# Patient Record
Sex: Female | Born: 1945 | Race: White | Hispanic: No | Marital: Married
Health system: Midwestern US, Academic
[De-identification: ages and names within clinical notes are randomized; demographics above are authoritative.]

---

## 2018-09-20 ENCOUNTER — Encounter: Admit: 2018-09-20 | Discharge: 2018-09-21 | Payer: MEDICARE

## 2018-09-20 ENCOUNTER — Encounter: Admit: 2018-09-20 | Discharge: 2018-09-20 | Payer: MEDICARE

## 2018-09-20 DIAGNOSIS — J45909 Unspecified asthma, uncomplicated: ICD-10-CM

## 2018-09-20 DIAGNOSIS — I10 Essential (primary) hypertension: Principal | ICD-10-CM

## 2018-09-20 DIAGNOSIS — K229 Disease of esophagus, unspecified: ICD-10-CM

## 2018-09-20 DIAGNOSIS — E785 Hyperlipidemia, unspecified: ICD-10-CM

## 2018-09-20 DIAGNOSIS — R911 Solitary pulmonary nodule: ICD-10-CM

## 2018-09-20 MED ORDER — LABETALOL 5 MG/ML IV SYRG
5 mg | INTRAVENOUS | 0 refills | Status: DC | PRN
Start: 2018-09-20 — End: 2018-09-21

## 2018-09-20 MED ORDER — ONDANSETRON HCL (PF) 4 MG/2 ML IJ SOLN
4 mg | INTRAVENOUS | 0 refills | Status: DC | PRN
Start: 2018-09-20 — End: 2018-09-22

## 2018-09-20 MED ORDER — ALBUTEROL SULFATE 2.5 MG/0.5 ML IN NEBU
2.5 mg | RESPIRATORY_TRACT | 0 refills | Status: DC | PRN
Start: 2018-09-20 — End: 2018-09-22

## 2018-09-20 NOTE — Care Coordination-Inpatient
AOD acceptance note:    73 yo F with sudden onset dysphagia, unable to swallow spit prior to presenting to outside ER.    CT of chest showing 5cm Esophageal mass with multiple lung nodules, concerning for new cancer. O2 sat >92% on RA. NO concerns for airway compromise.    Outside hospital invoking EMTALA as they do not have GI.    WBC 12.9  HGB 14.3  HCT 43.8  Platelets 150  Lactic 1.1  NA 140  K 5.1  Chloride 105  Co2   21  BUN 24  Creatinine 1.09  Glucose 108  AST 26  ALT 23  Alk phos 87    Accepted as Med/Surg status. Will need GI consult.    Andree Coss, MD  [phone]

## 2018-09-21 ENCOUNTER — Encounter: Admit: 2018-09-21 | Discharge: 2018-09-21 | Payer: MEDICARE

## 2018-09-21 ENCOUNTER — Inpatient Hospital Stay: Admit: 2018-09-21 | Discharge: 2018-09-21 | Payer: MEDICARE

## 2018-09-21 DIAGNOSIS — J45909 Unspecified asthma, uncomplicated: ICD-10-CM

## 2018-09-21 DIAGNOSIS — K2210 Ulcer of esophagus without bleeding: Principal | ICD-10-CM

## 2018-09-21 DIAGNOSIS — I10 Essential (primary) hypertension: Principal | ICD-10-CM

## 2018-09-21 DIAGNOSIS — K229 Disease of esophagus, unspecified: ICD-10-CM

## 2018-09-21 DIAGNOSIS — E785 Hyperlipidemia, unspecified: ICD-10-CM

## 2018-09-21 LAB — TROPONIN-I: Lab: 0 ng/mL (ref 0.0–0.05)

## 2018-09-21 LAB — CBC AND DIFF
Lab: 0 10*3/uL (ref 0–0.20)
Lab: 0 10*3/uL (ref 0–0.45)
Lab: 8.5 10*3/uL (ref 4.5–11.0)

## 2018-09-21 LAB — COMPREHENSIVE METABOLIC PANEL
Lab: 11 10*3/uL — ABNORMAL HIGH (ref 3–12)
Lab: 141 MMOL/L (ref 137–147)
Lab: 15 U/L — ABNORMAL LOW (ref 7–40)
Lab: 153 mg/dL — ABNORMAL HIGH (ref 70–100)
Lab: 17 U/L (ref 7–56)
Lab: 4.3 g/dL — ABNORMAL HIGH (ref 3.5–5.0)
Lab: 6.9 g/dL (ref 6.0–8.0)
Lab: 73 U/L — ABNORMAL LOW (ref 25–110)
Lab: 9.4 mg/dL (ref 8.5–10.6)
Lab: >60 mL/min — ABNORMAL LOW (ref >60)

## 2018-09-21 LAB — PTT (APTT): Lab: 27 s (ref 24.0–36.5)

## 2018-09-21 LAB — PROTIME INR (PT): Lab: 1 (ref 0.8–1.2)

## 2018-09-21 MED ORDER — ASPIRIN 81 MG PO TBEC
81 mg | Freq: Every day | ORAL | 0 refills | Status: DC
Start: 2018-09-21 — End: 2018-09-22
  Administered 2018-09-21 – 2018-09-22 (×2): 81 mg via ORAL

## 2018-09-21 MED ORDER — PROPOFOL INJ 10 MG/ML IV VIAL
0 refills | Status: DC
Start: 2018-09-21 — End: 2018-09-21
  Administered 2018-09-21: 17:00:00 40 mg via INTRAVENOUS
  Administered 2018-09-21: 17:00:00 30 mg via INTRAVENOUS
  Administered 2018-09-21: 17:00:00 20 mg via INTRAVENOUS

## 2018-09-21 MED ORDER — ROSUVASTATIN 10 MG PO TAB
5 mg | Freq: Every day | ORAL | 0 refills | Status: DC
Start: 2018-09-21 — End: 2018-09-22
  Administered 2018-09-21 – 2018-09-22 (×2): 5 mg via ORAL

## 2018-09-21 MED ORDER — POLYETHYLENE GLYCOL 3350 17 GRAM PO PWPK
1 | Freq: Every day | ORAL | 0 refills | Status: DC | PRN
Start: 2018-09-21 — End: 2018-09-22

## 2018-09-21 MED ORDER — PROPOFOL 10 MG/ML IV EMUL 20 ML (INFUSION)(AM)(OR)
INTRAVENOUS | 0 refills | Status: DC
Start: 2018-09-21 — End: 2018-09-21
  Administered 2018-09-21: 17:00:00 120 ug/kg/min via INTRAVENOUS

## 2018-09-21 MED ORDER — LOSARTAN 50 MG PO TAB
50 mg | Freq: Every day | ORAL | 0 refills | Status: DC
Start: 2018-09-21 — End: 2018-09-22
  Administered 2018-09-21 – 2018-09-22 (×2): 50 mg via ORAL

## 2018-09-21 MED ORDER — PANTOPRAZOLE 40 MG PO TBEC
40 mg | Freq: Two times a day (BID) | ORAL | 0 refills | Status: DC
Start: 2018-09-21 — End: 2018-09-22
  Administered 2018-09-21 – 2018-09-22 (×3): 40 mg via ORAL

## 2018-09-21 MED ORDER — SODIUM CHLORIDE 0.9 % IV SOLP
INTRAVENOUS | 0 refills | Status: CN
Start: 2018-09-21 — End: ?

## 2018-09-21 MED ORDER — BENZOCAINE-MENTHOL 6-10 MG MM LOZG
1 | ORAL | 0 refills | Status: DC | PRN
Start: 2018-09-21 — End: 2018-09-22

## 2018-09-21 MED ORDER — LIDOCAINE (PF) 200 MG/10 ML (2 %) IJ SYRG
0 refills | Status: DC
Start: 2018-09-21 — End: 2018-09-21
  Administered 2018-09-21: 17:00:00 40 mg via INTRAVENOUS

## 2018-09-21 MED ORDER — HYDROCHLOROTHIAZIDE 12.5 MG PO TAB
12.5 mg | Freq: Every day | ORAL | 0 refills | Status: DC
Start: 2018-09-21 — End: 2018-09-22
  Administered 2018-09-21 – 2018-09-22 (×2): 12.5 mg via ORAL

## 2018-09-21 MED ORDER — LACTATED RINGERS IV SOLP
0 refills | Status: DC
Start: 2018-09-21 — End: 2018-09-21
  Administered 2018-09-21: 17:00:00 via INTRAVENOUS

## 2018-09-21 MED ORDER — LACTATED RINGERS IV SOLP
1000 mL | Freq: Once | INTRAVENOUS | 0 refills | Status: CP
Start: 2018-09-21 — End: ?
  Administered 2018-09-21: 16:00:00 1000 mL via INTRAVENOUS

## 2018-09-21 NOTE — H&P (View-Only)
ADMISSION HISTORY AND PHYSICAL    Name:  Karina Flores                                             MRN:  [id number]   Admission Date:  09/20/2018       ASSESSMENT & PLAN     Maleta Lerma is a 73 y.o. female with PMHx HTN, HLD, asthma who presents to [facility] as direct transfer from [facility] for further work-up of dysphagia and esophageal mass.    #Dysphagia  #Esophageal Mass  - 1 day of acute onset dysphagia to solids and liquids. Severe pain with attempting to swallow. 1 episode of hematemesis immediately with eating. Pt with long  Smoking (former), EtOH use hx. Significant family hx of cancer.  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    -1.3cm CM Rt hilar node   -Multiple pulmonary nodules, measuring up to 3mm   -Liver, spleen, pancreas, gallbladder, adreanl galnds grossly unremarkable. Moderate to severe atheroscleoric diseaes. Sub-cm retroperitoneal nodes.    Plan:  > Consult GI in AM (order placed), appreciate assistance  > Strict NPO. Pt at risk of aspiration with possible esophageal obstruction  > Hold on oncology consult until more definitive dx  > Hold on repeat imaging. OSH images clouded    #Troponinemia  - Chest pain only with swallowing. OSH trop 0.015. Now not complaining of chest pain since not eating.   Plan:   > Repeat troponin   > Obtain ECG    #Recent URI  #Hx of asthma  - Pt with asthma as a child. Recent URI sxs ~4 wks ago. Still with mild congestion and DOE. Has been using albuterol neb following walking up 2 flights of stairs.  Plan:  > Albuterol neb q4h      #HTN  #HLD  - PTA on ASA 81mg , HCTZ 12.5mg  QDay, Losartan 50mg  QDay, Rosuvastatin 5mg  QDay  Plan:  > Hold PTA meds in setting of strict NPO  > Labetalol 5mg  IV q6h PRN     Checklist:  Consults: Gastroenterology  Lines/Access: PIVs Family history     Father: Lung cancer (smoker)  Maternal aunt: Breast cancer  MGF: Throat cancer (chewing tobacco user)  Aunt: Unknown type of cancer  Uncle: Unknown type cancer    Social history     Works part-time  Smoking history: Former smoker, quit ~2015, <1 PPD x ~50 yrs (since high school)  EtOH history: current, 1 glass wine/night, started in high school.    Allergies   Oxycontin [oxycodone]    Medications     Current Facility-Administered Medications   Medication   ??? albuterol 0.5% (PROVENTIL) nebulizer solution 2.5 mg   ??? labetaloL (NORMODYNE) injection 5 mg   ??? ondansetron (ZOFRAN) injection 4 mg          Physical exam     BP: 154/76 (04/05 2100)  Temp: 37.1 ???C (98.7 ???F) (04/05 2102)  Pulse: 107 (04/05 2102)  Respirations: 18 PER MINUTE (04/05 2102)  SpO2: 97 % (04/05 2102)  Height: 157.5 cm (62) (04/05 2100) BP: (154)/(76)   Temp:  [37.1 ???C (98.7 ???F)]   Pulse:  [107]   Respirations:  Vera.August  PER MINUTE]   SpO2:  [97 %]   Vitals:    09/20/18 2100   Weight: 72.4 kg (159 lb 11.2 oz)        General:  Alert and response to question appropriately.  Appears stated age.  Lying in bed in NAD.  VS as above.  Eyes: No conjunctival injection. PERRLA. EOMI. No scleral icterus.    Ears, nose, mouth, and throat : MMM, posterior pharynx mildly erythematous without exudate, edema.  Neck:  Symmetric appearance without crepitus, no obvious mass or noticeable swelling,   Lungs:  Clear to auscultation bilaterally without wheezes, rales, or rhonchi.  No use of accessory muscles.    Cardiovascular: Regular rate, S1, S2 normal, no murmurs, clicks, rubs, or gallops appreciated .  2+ and symmetric, all extremities.  No edema in BLE.   Abdomen:  BS+, soft, no guarding or rigidity.  Nontender to palpation without palpable masses.  No rebound tenderness.   Skin: Skin color normal, no obvious evidence of rashes.   Neurologic:  Alerted and oriented .  5/5 hand-grip and distal strength.

## 2018-09-21 NOTE — Consults
accompanying hilar lymphadenopathy  2. Dysphagia/Odynophagia -sudden onset, 1 day history. No unintentional weight loss. No prior EGD or colonoscopy.  3. Hypertension   4. Asthma    4/6 EGD showing 2 linear nonbleeding esophageal ulcers which were biopsied for margins.  Medium-sized hiatal hernia.  Inflammation within the gastric cardia.  Multiple small nonbleeding in the antrum without stigmata.  Duodenum was normal.  No obstructive lesion Was seen in the esophagus or hiatal hernia sac.    In the suspicious that she may have had a food bolus impaction at the time of CT which led to the mass seen in the subsequent kissing ulcers seen on endoscopy.    Recommendations:  -PPI twice daily x8 weeks  -Repeat EGD along with colonoscopy in 8 weeks (ordered). Patient has never had screening colonoscopy.     GI will sign off at this time, please call with questions    Patient seen/discussed with Dr. Rozell Searing  GI Fellow  [phone]    -----------------------------  PMH:  Medical History:   Diagnosis Date   ??? Asthma    ??? HLD (hyperlipidemia)    ??? HTN (hypertension)        Current medications:  No current facility-administered medications on file prior to encounter.      No current outpatient medications on file prior to encounter.       PSH:  No past surgical history on file.    SH:  Social History     Socioeconomic History   ??? Marital status: Not on file     Spouse name: Not on file   ??? Number of children: Not on file   ??? Years of education: Not on file   ??? Highest education level: Not on file   Occupational History   ??? Not on file   Tobacco Use   ??? Smoking status: Former Smoker     Packs/day: 1.00     Years: 50.00     Pack years: 50.00     Types: Cigarettes     Last attempt to quit: 2015     Years since quitting: 5.2   ??? Smokeless tobacco: Never Used   Substance and Sexual Activity   ??? Alcohol use: Yes     Alcohol/week: 7.0 standard drinks     Types: 7 Glasses of wine per week     Drinks per session: 1 or 2

## 2018-09-21 NOTE — Progress Notes
Patient arrived to room # [id number]) via cart accompanied by transport. Patient transferred to the bed without assistance. Bedside safety checks completed. Initial patient assessment completed. Refer to flowsheet for details.    Admission skin assessment completed with: Maurene Capes, RN    Pressure injury present on arrival?: No    1. Head/Face/Neck: No  2. Trunk/Back: No  3. Upper Extremities: No  4. Lower Extremities: No  5. Pelvic/Coccyx: No  6. Assessed for device associated injury? Yes  7. Malnutrition Screening Tool (Nursing Nutrition Assessment) Completed? Yes    See Doc Flowsheet for additional wound details.     INTERVENTIONS:

## 2018-09-22 ENCOUNTER — Encounter: Admit: 2018-09-22 | Discharge: 2018-09-22 | Payer: MEDICARE

## 2018-09-22 ENCOUNTER — Inpatient Hospital Stay: Admit: 2018-09-21 | Discharge: 2018-09-21 | Payer: MEDICARE

## 2018-09-22 ENCOUNTER — Inpatient Hospital Stay
Admit: 2018-09-21 | Discharge: 2018-09-22 | Disposition: A | Discharge status: Home / Self Care | Payer: MEDICARE | Source: Other Acute Inpatient Hospital

## 2018-09-22 DIAGNOSIS — Z885 Allergy status to narcotic agent status: ICD-10-CM

## 2018-09-22 DIAGNOSIS — K449 Diaphragmatic hernia without obstruction or gangrene: ICD-10-CM

## 2018-09-22 DIAGNOSIS — K319 Disease of stomach and duodenum, unspecified: ICD-10-CM

## 2018-09-22 DIAGNOSIS — Z7982 Long term (current) use of aspirin: ICD-10-CM

## 2018-09-22 DIAGNOSIS — Z87891 Personal history of nicotine dependence: ICD-10-CM

## 2018-09-22 DIAGNOSIS — Z79899 Other long term (current) drug therapy: ICD-10-CM

## 2018-09-22 DIAGNOSIS — R918 Other nonspecific abnormal finding of lung field: ICD-10-CM

## 2018-09-22 DIAGNOSIS — I10 Essential (primary) hypertension: Principal | ICD-10-CM

## 2018-09-22 DIAGNOSIS — J45909 Unspecified asthma, uncomplicated: ICD-10-CM

## 2018-09-22 DIAGNOSIS — R9431 Abnormal electrocardiogram [ECG] [EKG]: ICD-10-CM

## 2018-09-22 DIAGNOSIS — R1310 Dysphagia, unspecified: ICD-10-CM

## 2018-09-22 DIAGNOSIS — K2210 Ulcer of esophagus without bleeding: Principal | ICD-10-CM

## 2018-09-22 DIAGNOSIS — E785 Hyperlipidemia, unspecified: ICD-10-CM

## 2018-09-22 DIAGNOSIS — R000 Tachycardia, unspecified: ICD-10-CM

## 2018-09-22 DIAGNOSIS — K5900 Constipation, unspecified: ICD-10-CM

## 2018-09-22 MED ORDER — PANTOPRAZOLE 40 MG PO TBEC
40 mg | ORAL_TABLET | Freq: Two times a day (BID) | ORAL | 0 refills | 90.00000 days | Status: AC
Start: 2018-09-22 — End: ?

## 2018-09-22 MED ORDER — BENZOCAINE-MENTHOL 6-10 MG MM LOZG
1 | ORAL | 0 refills | 30.00000 days | Status: AC | PRN
Start: 2018-09-22 — End: ?

## 2018-09-22 MED ORDER — POLYETHYLENE GLYCOL 3350 17 GRAM PO PWPK
17 g | Freq: Every day | ORAL | 0 refills | 18.00000 days | Status: AC | PRN
Start: 2018-09-22 — End: ?

## 2018-09-22 NOTE — Progress Notes
Trudie Reed discharged on 09/22/2018.   Marland Kitchen  Discharge instructions reviewed with patient.  Valuables returned:   Personal Items / Valuables: Cell Phone, Clothing, Eyeglasses/Contacts  Where Are Valuables Stored?: bedside.  Home medications:    n/a  Functional assessment at discharge complete: Yes .  Discharge instructions reviewed with patient. AVS provided. All questions and concerns addressed. Peripheral IV removed. Wheelchair to lobby for DC.

## 2018-09-23 NOTE — Discharge Instructions - Pharmacy
CBC AND DIFF   ??? Collection Time: 09/20/18 11:45 PM   Result Value Ref Range   ??? White Blood Cells 8.5 4.5 - 11.0 K/UL   ??? RBC 4.45 4.0 - 5.0 M/UL   ??? Hemoglobin 14.4 12.0 - 15.0 GM/DL   ??? Hematocrit 42.4 36 - 45 %   ??? MCV 95.4 80 - 100 FL   ??? MCH 32.5 26 - 34 PG   ??? MCHC 34.0 32.0 - 36.0 G/DL   ??? RDW 13.1 11 - 15 %   ??? Platelet Count 238 150 - 400 K/UL   ??? MPV 7.9 7 - 11 FL   ??? Neutrophils 95 (H) 41 - 77 %   ??? Lymphocytes 4 (L) 24 - 44 %   ??? Monocytes 1 (L) 4 - 12 %   ??? Eosinophils 0 0 - 5 %   ??? Basophils 0 0 - 2 %   ??? Absolute Neutrophil Count 8.10 (H) 1.8 - 7.0 K/UL   ??? Absolute Lymph Count 0.30 (L) 1.0 - 4.8 K/UL   ??? Absolute Monocyte Count 0.00 0 - 0.80 K/UL   ??? Absolute Eosinophil Count 0.00 0 - 0.45 K/UL   ??? Absolute Basophil Count 0.00 0 - 0.20 K/UL   COMPREHENSIVE METABOLIC PANEL   ??? Collection Time: 09/20/18 11:45 PM   Result Value Ref Range   ??? Sodium 141 137 - 147 MMOL/L   ??? Potassium 4.2 3.5 - 5.1 MMOL/L   ??? Chloride 108 98 - 110 MMOL/L   ??? Glucose 153 (H) 70 - 100 MG/DL   ??? Blood Urea Nitrogen 19 7 - 25 MG/DL   ??? Creatinine 0.85 0.4 - 1.00 MG/DL   ??? Calcium 9.4 8.5 - 10.6 MG/DL   ??? Total Protein 6.9 6.0 - 8.0 G/DL   ??? Total Bilirubin 0.4 0.3 - 1.2 MG/DL   ??? Albumin 4.3 3.5 - 5.0 G/DL   ??? Alk Phosphatase 73 25 - 110 U/L   ??? AST (SGOT) 15 7 - 40 U/L   ??? CO2 22 21 - 30 MMOL/L   ??? ALT (SGPT) 17 7 - 56 U/L   ??? Anion Gap 11 3 - 12   ??? eGFR Non African American >60 >60 mL/min   ??? eGFR African American >60 >60 mL/min   MAGNESIUM   ??? Collection Time: 09/20/18 11:45 PM   Result Value Ref Range   ??? Magnesium 2.0 1.6 - 2.6 mg/dL   TSH WITH FREE T4 REFLEX   ??? Collection Time: 09/20/18 11:45 PM   Result Value Ref Range   ??? TSH 0.93 0.35 - 5.00 MCU/ML   TROPONIN-I   ??? Collection Time: 09/20/18 11:45 PM   Result Value Ref Range   ??? Troponin-I 0.02 0.0 - 0.05 NG/ML   PTT (APTT)   ??? Collection Time: 09/21/18 12:27 AM   Result Value Ref Range   ??? APTT 27.3 24.0 - 36.5 SEC   PROTIME INR (PT) ??? Collection Time: 09/21/18 12:27 AM   Result Value Ref Range   ??? INR 1.0 0.8 - 1.2   ???  Glucose: (!) 153 (09/20/18 2345)          Brief Hospital Course:    Karina Flores is a 73 y.o. female with a PMH hx of HTN, HLD, asthma, 50 pack per year smoking hx quite in 2015, and 55 yr ETOH use who was a transfer from [facility] ER and admitted for severe odynophagia and esophageal mass. 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 1.3cm CM Rt hilar node. Multiple pulmonary nodules, measuring up to 3mm. Liver, spleen, pancreas, gallbladder, adreanl galnds grossly unremarkable. Moderate to severe atheroscleoric diseaes. Sub-cm retroperitoneal nodes. GI was consulted and an EGD was done on 4/6. This showed non-bleeding esophageal ulcers; medium sized hiatal hernia; non-bleeding erosive gastropathy; no obstructive lesion suspect food bolus at time of CT. Biopsies taken and were Hy. Pylori negative and negative for dysplasia and malignancy. She was started on a PPI x 8 weeks and will follow up with GI in 8 weeks for a repeat EGD. Also sent home with lozenges for throat pain.     Spoke with the patients PCP Dr. Herschell Dimes regarding the pulmonary nodules; he said they are <3 cm and outpatient follow up and imaging can be done. Patient want to follow up with a pulmonologist for lung nodules. A referral to [facility] was sent.         Condition at Discharge: Stable    Discharge Diagnoses:      Hospital Problems        Active Problems    * (Principal) Dysphagia    Esophageal mass    Hypertension    Hyperlipidemia    Incidental lung nodule, greater than or equal to 8mm          Surgical Procedures: None    Significant Diagnostic Studies and Procedures: noted in brief hospital course    Consults:  GI    Patient Disposition: Home       Patient instructions/medications:       Regular Diet Details   albuterol 0.5% (PROVENTIL) 2.5 mg/0.5 mL nebulizer solution Inhale 2.5 mg solution by nebulizer as directed every 6 hours as needed for Shortness of Breath or Wheezing.    PRESCRIPTION TYPE:  Historical Med      aspirin EC 81 mg tablet Take 81 mg by mouth daily. Take with food.    PRESCRIPTION TYPE:  Historical Med      calcium carbonate (TUMS) 500 mg (200 mg elemental calcium) chewable tablet Chew 500 mg by mouth daily.    PRESCRIPTION TYPE:  Historical Med      cetirizine (ZYRTEC) 10 mg tablet Take 10 mg by mouth every morning.    PRESCRIPTION TYPE:  Historical Med      hydroCHLOROthiazide (HYDRODIURIL) 12.5 mg tablet Take 12.5 mg by mouth every morning.    PRESCRIPTION TYPE:  Historical Med      losartan (COZAAR) 50 mg tablet Take 50 mg by mouth daily.    PRESCRIPTION TYPE:  Historical Med      rosuvastatin (CRESTOR) 5 mg tablet Take 5 mg by mouth daily.    PRESCRIPTION TYPE:  Historical Med           Scheduled appointments:     You will be contacted by pulmonology to set up an appointment.                Pending items needing follow up: none    Signed:  Angelica Ran, APRN-NP  09/23/2018      cc:  Primary Care Physician:  Lona Kettle, MD   Verified  Referring physicians:  Lona Kettle, MD   Additional provider(s):

## 2018-09-24 ENCOUNTER — Ambulatory Visit: Admit: 2018-12-04 | Discharge: 2018-12-04

## 2018-09-24 ENCOUNTER — Encounter: Admit: 2018-09-24 | Discharge: 2018-09-24 | Payer: MEDICARE

## 2018-09-30 ENCOUNTER — Encounter: Admit: 2018-09-30 | Discharge: 2018-09-30 | Payer: MEDICARE

## 2018-10-05 ENCOUNTER — Encounter: Admit: 2018-10-05 | Discharge: 2018-10-05 | Payer: MEDICARE

## 2018-10-09 ENCOUNTER — Encounter: Admit: 2018-10-09 | Discharge: 2018-10-09 | Payer: MEDICARE

## 2018-10-09 ENCOUNTER — Ambulatory Visit: Admit: 2018-10-09 | Discharge: 2018-10-09 | Payer: MEDICARE

## 2018-10-09 DIAGNOSIS — I10 Essential (primary) hypertension: Principal | ICD-10-CM

## 2018-10-09 DIAGNOSIS — R911 Solitary pulmonary nodule: Principal | ICD-10-CM

## 2018-10-09 DIAGNOSIS — J45909 Unspecified asthma, uncomplicated: ICD-10-CM

## 2018-10-09 DIAGNOSIS — E785 Hyperlipidemia, unspecified: ICD-10-CM

## 2018-10-19 ENCOUNTER — Encounter: Admit: 2018-10-19 | Discharge: 2018-10-19 | Payer: MEDICARE

## 2018-11-23 ENCOUNTER — Encounter: Admit: 2018-11-23 | Discharge: 2018-11-23

## 2018-11-23 DIAGNOSIS — Z1159 Encounter for screening for other viral diseases: Secondary | ICD-10-CM

## 2018-12-02 ENCOUNTER — Encounter: Admit: 2018-12-02 | Discharge: 2018-12-02

## 2018-12-02 ENCOUNTER — Ambulatory Visit: Admit: 2018-12-02 | Discharge: 2018-12-03

## 2018-12-02 NOTE — Progress Notes
Patient arrived to [facility] for COVID-19 testing 12/02/18 0847. Patient identity confirmed via photo I.D. Nasopharyngeal procedure explained to the patient.   Nasopharyngeal swab completed left.  Patient education provided given and instructed patient self isolate until contacted w/ results and further instructions.   Swab collected by Ladona Ridgel, RN.    Date symptoms began/reason for testing: 6/19 - GI pre procedure.

## 2018-12-03 DIAGNOSIS — Z1159 Encounter for screening for other viral diseases: Principal | ICD-10-CM

## 2018-12-03 LAB — COVID-19 (SARS-COV-2) PCR

## 2018-12-04 ENCOUNTER — Encounter: Admit: 2018-12-04 | Discharge: 2018-12-04

## 2018-12-04 ENCOUNTER — Ambulatory Visit: Admit: 2018-12-04 | Discharge: 2018-12-04

## 2018-12-04 ENCOUNTER — Ambulatory Visit: Admit: 2018-12-04 | Discharge: 2018-12-04 | Payer: MEDICARE

## 2018-12-04 DIAGNOSIS — Z885 Allergy status to narcotic agent status: Secondary | ICD-10-CM

## 2018-12-04 DIAGNOSIS — Z87891 Personal history of nicotine dependence: ICD-10-CM

## 2018-12-04 DIAGNOSIS — Z801 Family history of malignant neoplasm of trachea, bronchus and lung: ICD-10-CM

## 2018-12-04 DIAGNOSIS — J45909 Unspecified asthma, uncomplicated: Secondary | ICD-10-CM

## 2018-12-04 DIAGNOSIS — Z79899 Other long term (current) drug therapy: Secondary | ICD-10-CM

## 2018-12-04 DIAGNOSIS — K210 Gastro-esophageal reflux disease with esophagitis: Secondary | ICD-10-CM

## 2018-12-04 DIAGNOSIS — Z7982 Long term (current) use of aspirin: Secondary | ICD-10-CM

## 2018-12-04 DIAGNOSIS — I10 Essential (primary) hypertension: Secondary | ICD-10-CM

## 2018-12-04 DIAGNOSIS — E785 Hyperlipidemia, unspecified: ICD-10-CM

## 2018-12-04 DIAGNOSIS — K2210 Ulcer of esophagus without bleeding: Principal | ICD-10-CM

## 2018-12-04 DIAGNOSIS — Z09 Encounter for follow-up examination after completed treatment for conditions other than malignant neoplasm: Principal | ICD-10-CM

## 2018-12-04 DIAGNOSIS — Z800 Family history of malignant neoplasm of digestive organs: Secondary | ICD-10-CM

## 2018-12-04 DIAGNOSIS — K449 Diaphragmatic hernia without obstruction or gangrene: Secondary | ICD-10-CM

## 2018-12-04 MED ORDER — LACTATED RINGERS IV SOLP
0 refills | Status: DC
Start: 2018-12-04 — End: 2018-12-04
  Administered 2018-12-04: 15:00:00 via INTRAVENOUS

## 2018-12-04 MED ORDER — LIDOCAINE (PF) 200 MG/10 ML (2 %) IJ SYRG
0 refills | Status: DC
Start: 2018-12-04 — End: 2018-12-04
  Administered 2018-12-04: 16:00:00 40 mg via INTRAVENOUS

## 2018-12-04 MED ORDER — PROPOFOL 10 MG/ML IV EMUL 20 ML (INFUSION)(AM)(OR)
INTRAVENOUS | 0 refills | Status: DC
Start: 2018-12-04 — End: 2018-12-04
  Administered 2018-12-04: 16:00:00 125 ug/kg/min via INTRAVENOUS

## 2018-12-04 MED ORDER — OMEPRAZOLE 40 MG PO CPDR
40 mg | ORAL_CAPSULE | Freq: Every day | ORAL | 5 refills | Status: DC
Start: 2018-12-04 — End: 2019-05-27

## 2018-12-04 MED ORDER — PROPOFOL INJ 10 MG/ML IV VIAL
0 refills | Status: DC
Start: 2018-12-04 — End: 2018-12-04
  Administered 2018-12-04: 16:00:00 20 mg via INTRAVENOUS
  Administered 2018-12-04: 16:00:00 40 mg via INTRAVENOUS

## 2018-12-04 MED ORDER — LACTATED RINGERS IV SOLP
Freq: Once | INTRAVENOUS | 0 refills | Status: CP
Start: 2018-12-04 — End: ?
  Administered 2018-12-04: 15:00:00 1000.000 mL via INTRAVENOUS

## 2018-12-04 NOTE — H&P (View-Only)
Pre Procedure History and Physical/Sedation Plan    Date of Procedure:  12/04/2018    Planned Procedure(s):  EGD  Sedation/Medication Plan: MAC (Monitored Anesthesia Care)  Discussion/Reviews:  Physician has discussed risks and alternatives of this type of sedation and above planned procedures with patient.  ___________________________________________________________________  Chief Complaint:  Follow up esophageal ulcers. Denies dysphagia, only very rare reflux symptoms, does not take RX antacids.     History of Present Illness: Karina Flores is a 73 y.o. female with cc  Medical History:   Diagnosis Date   ??? Asthma    ??? HLD (hyperlipidemia)    ??? HTN (hypertension)        Surgical History:   Procedure Laterality Date   ??? EGD N/A 09/21/2018    Performed by Samuel Jester, MD at [facility]   ??? ESOPHAGOGASTRODUODENOSCOPY WITH BIOPSY - FLEXIBLE  09/21/2018    Performed by Samuel Jester, MD at [facility]       Allergies   Allergen Reactions   ??? Oxycontin [Oxycodone] HALLUCINATIONS       Family History   Problem Relation Age of Onset   ??? Cancer-Lung Father    ??? Esophageal Cancer Maternal Grandfather        Previous Anesthetic/Sedation History:  No complications    Nursing Medical History     Nursing Surgical History     Social History     Tobacco Use   ??? Smoking status: Former Smoker     Packs/day: 1.00     Years: 50.00     Pack years: 50.00     Types: Cigarettes     Last attempt to quit: 2015     Years since quitting: 5.4   ??? Smokeless tobacco: Never Used   Substance Use Topics   ??? Alcohol use: Yes     Alcohol/week: 7.0 standard drinks     Types: 7 Glasses of wine per week     Drinks per session: 1 or 2     Binge frequency: Never   ??? Drug use: Never     Social History     Substance and Sexual Activity   Drug Use Never     Allergies:  Oxycontin [oxycodone]  Medications  Medications Prior to Admission   Medication Sig   ??? albuterol 0.5% (PROVENTIL) 2.5 mg/0.5 mL nebulizer solution Inhale 2.5 Labs:  Relevant labs reviewed    ATTESTATION  I personally performed the key portions of the E/M visit, discussed case with fellow and concur with fellow documentation of history, physical exam, assessment, and treatment plan unless otherwise noted.        Tim Lair, MD

## 2018-12-04 NOTE — Anesthesia Post-Procedure Evaluation
Post-Anesthesia Evaluation    Name: Karina Flores      MRN: [id number]     DOB: [birth year]     Age: 73 y.o.     Sex: female   __________________________________________________________________________     Procedure Date: 12/04/2018  Procedure(s):  ESOPHAGOGASTRODUODENOSCOPY WITH BIOPSY - FLEXIBLE      Surgeon: Surgeon(s):  Allene Dillon, MD    Post-Anesthesia Vitals  BP: 117/75 (06/19 1050)  Temp: 36.6 C (97.9 F) (06/19 1039)  Pulse: 73 (06/19 1050)  Respirations: 13 PER MINUTE (06/19 1050)  SpO2: 98 % (06/19 1050)  SpO2 Pulse: 72 (06/19 1050)  Height: 160 cm (63") (06/19 0952)   Vitals Value Taken Time   BP 117/75 12/04/2018 10:50 AM   Temp     Pulse 73 12/04/2018 10:50 AM   Respirations 13 PER MINUTE 12/04/2018 10:50 AM   SpO2 98 % 12/04/2018 10:50 AM         Post Anesthesia Evaluation Note    Evaluation location: other  Patient participation: recovered; patient participated in evaluation  Level of consciousness: alert    Pain score: 0  Pain management: adequate    Hydration: normovolemia  Temperature: 36.0C - 38.4C  Airway patency: adequate    Perioperative Events       Post-op nausea and vomiting: no PONV    Postoperative Status  Cardiovascular status: hemodynamically stable  Respiratory status: spontaneous ventilation  Follow-up needed: none        Perioperative Events  Perioperative Event: No  Emergency Case Activation: No

## 2018-12-05 ENCOUNTER — Encounter: Admit: 2018-12-05 | Discharge: 2018-12-05

## 2018-12-05 DIAGNOSIS — I10 Essential (primary) hypertension: Secondary | ICD-10-CM

## 2018-12-05 DIAGNOSIS — E785 Hyperlipidemia, unspecified: Secondary | ICD-10-CM

## 2018-12-05 DIAGNOSIS — J45909 Unspecified asthma, uncomplicated: Secondary | ICD-10-CM

## 2018-12-08 ENCOUNTER — Encounter: Admit: 2018-12-08 | Discharge: 2018-12-08

## 2019-01-07 ENCOUNTER — Encounter: Admit: 2019-01-07 | Discharge: 2019-01-07

## 2019-01-08 ENCOUNTER — Encounter: Admit: 2019-01-08 | Discharge: 2019-01-08

## 2019-01-08 ENCOUNTER — Ambulatory Visit: Admit: 2019-01-08 | Discharge: 2019-01-08

## 2019-01-08 DIAGNOSIS — I10 Essential (primary) hypertension: Secondary | ICD-10-CM

## 2019-01-08 DIAGNOSIS — J45909 Unspecified asthma, uncomplicated: Secondary | ICD-10-CM

## 2019-01-08 DIAGNOSIS — E785 Hyperlipidemia, unspecified: Secondary | ICD-10-CM

## 2019-01-08 DIAGNOSIS — R911 Solitary pulmonary nodule: Principal | ICD-10-CM

## 2019-01-08 LAB — POC CREATININE, RAD: Lab: 1 mg/dL (ref 0.4–1.00)

## 2019-01-08 MED ORDER — SODIUM CHLORIDE 0.9 % IJ SOLN
50 mL | Freq: Once | INTRAVENOUS | 0 refills | Status: CP
Start: 2019-01-08 — End: ?
  Administered 2019-01-08: 16:00:00 50 mL via INTRAVENOUS

## 2019-01-08 MED ORDER — IOHEXOL 350 MG IODINE/ML IV SOLN
70 mL | Freq: Once | INTRAVENOUS | 0 refills | Status: CP
Start: 2019-01-08 — End: ?
  Administered 2019-01-08: 16:00:00 70 mL via INTRAVENOUS

## 2019-01-08 NOTE — Assessment & Plan Note
CT chest with improvement in faint infiltrate and no new nodules.  Given smoking history will repeat CT chest without contrast in 12 months.  If unchanged at that time will discontinue further CT chests.

## 2019-01-08 NOTE — Progress Notes
Date of Service: 01/08/2019    Subjective:             Karina Flores is a 73 y.o. female.    History of Present Illness  Returns for follow CT chest for incidental lung nodules.  She has NO pulmonary symptoms.  Denies cough, SOB, Wheezing, DOE.  Does NOT endorse fever, chills, night sweats, or unintentional weight loss.     Review of Systems   Constitutional: Negative.    HENT: Negative.    Eyes: Negative.    Respiratory: Negative.    Cardiovascular: Negative.    Musculoskeletal: Negative.    Skin: Negative.    Neurological: Negative.    Psychiatric/Behavioral: Negative.    All other systems reviewed and are negative.        Objective:         ??? albuterol 0.5% (PROVENTIL) 2.5 mg/0.5 mL nebulizer solution Inhale 2.5 mg solution by nebulizer as directed every 6 hours as needed for Shortness of Breath or Wheezing.   ??? aspirin EC 81 mg tablet Take 81 mg by mouth daily. Take with food.   ??? benzocaine/menthol (CHLORASEPTIC) 6-10 mg lozg lozenge Take one lozenge by mouth every 2 hours as needed.   ??? calcium carbonate (TUMS) 500 mg (200 mg elemental calcium) chewable tablet Chew 500 mg by mouth daily.   ??? cetirizine (ZYRTEC) 10 mg tablet Take 10 mg by mouth every morning.   ??? hydroCHLOROthiazide (HYDRODIURIL) 12.5 mg tablet Take 12.5 mg by mouth every morning.   ??? losartan (COZAAR) 50 mg tablet Take 50 mg by mouth daily.   ??? omeprazole DR (PRILOSEC) 40 mg capsule Take one capsule by mouth daily before breakfast.   ??? pantoprazole DR (PROTONIX) 40 mg tablet Take one tablet by mouth twice daily.   ??? polyethylene glycol 3350 (MIRALAX) 17 g packet Take one packet by mouth daily as needed.   ??? rosuvastatin (CRESTOR) 5 mg tablet Take 5 mg by mouth daily.     Vitals:    01/08/19 1249   BP: (!) 145/58   BP Source: Arm, Right Upper   Patient Position: Sitting   Pulse: 116   Temp: 37 ???C (98.6 ???F)   TempSrc: Oral   SpO2: 99%   Weight: 71.2 kg (157 lb)   Height: 160 cm (63)   PainSc: Zero     Body mass index is 27.81 kg/m???. Physical Exam  Vitals signs and nursing note reviewed.   Constitutional:       Appearance: Normal appearance.   HENT:      Head: Normocephalic and atraumatic.      Nose: Nose normal.      Mouth/Throat:      Mouth: Mucous membranes are dry.   Eyes:      Extraocular Movements: Extraocular movements intact.      Pupils: Pupils are equal, round, and reactive to light.   Neck:      Musculoskeletal: Normal range of motion and neck supple.   Cardiovascular:      Rate and Rhythm: Normal rate and regular rhythm.      Pulses: Normal pulses.      Heart sounds: Normal heart sounds.   Pulmonary:      Effort: Pulmonary effort is normal.      Breath sounds: Normal breath sounds.   Abdominal:      General: Abdomen is flat.      Palpations: Abdomen is soft.   Musculoskeletal: Normal range of motion.   Skin:  General: Skin is warm and dry.      Capillary Refill: Capillary refill takes less than 2 seconds.   Neurological:      General: No focal deficit present.      Mental Status: She is alert.   Psychiatric:         Mood and Affect: Mood normal.         Behavior: Behavior normal.                  CT Chest  ???  Clinical Indication: Multiple pulmonary nodules.  ???  Technique: Multiple contiguous axial CT images were obtained through the chest following the administration of IV contrast. Post processing coronal and sagittal reconstruction images were made from the axial images.  ???  IV contrast: Omnipaque 350.  ???  Comparison: Outside chest CT dated 09/20/2018.  ???  Findings:  ???  Lower neck: Unremarkable.  ???  Axilla, Mediastinum and Hila: No significant adenopathy. Small hiatal hernia. There is decompression of previously noted dilated fluid-filled esophagus.  ???  Heart and Great Vessels: The heart is normal in size. No pericardial effusion. At least mild coronary artery calcifications are seen. Great vessels are normal in caliber. Mild atherosclerotic calcifications about the thoracic aorta.  ??? Trachea and Major Bronchi: Minimal amount of secretions are identified about the right main bronchus and segmental branches.Marland Kitchen   ???  Lungs and Pleura: Mild scattered pleuroparenchymal scars are again noted. Stable small scattered right pulmonary nodules are noted predominantly in the right lung (series 6 image 39, 95, and 96) which are unchanged from prior examination of 09/20/2018. Previously noted small peripheral nodular opacities in the right lower lobe and right middle lobe are no longer apparent on the current examination and may represent resolution of peripheral round atelectasis or mucus plugging. No new or enlarging pulmonary nodules are identified. No focal consolidation, pleural effusion or pneumothorax.  ???  Upper Abdomen: Unremarkable.  ???  Chest Wall and Osseous Structures: Thoracic spondylosis is appreciated with mild accentuation of the upper thoracic kyphotic curve. Partial visualization of prior lower anterior cervical spinal fusion. No suspicious destructive osseous lesions.   ???  IMPRESSION  ???  1. STABLE SMALL SCATTERED RIGHT PULMONARY NODULES, WHICH MAY REPRESENT NONCALCIFIED GRANULOMAS OR NODULAR SCARS. NO NEW OR ENLARGING PULMONARY NODULES ARE IDENTIFIED. ACCORDING TO [employer] RECOMMENDATIONS FOR INCIDENTALLY DISCOVERED PULMONARY NODULES MEASURING 0.4 CM OR LESS, NO FOLLOWUP IS NEEDED IF THE PATIENT IS CONSIDERED LOW RISK. IF THE PATIENT IS CONSIDERED HIGH RISK, FOLLOW UP CT IS RECOMMENDED IN 12 MONTHS.  ???  2. PREVIOUSLY NOTED SMALL PERIPHERAL NODULAR OPACITIES IN THE RIGHT LOWER LOBE AND RIGHT MIDDLE LOBE ARE NO LONGER APPARENT ON THE CURRENT EXAMINATION AND MAY REPRESENT RESOLUTION OF PERIPHERAL ROUND ATELECTASIS OR MUCUS PLUGGING.  ???  3. NO THORACIC ADENOPATHY.  ???  4. SMALL HIATAL HERNIA.  ???  ???   Finalized by Particia Jasper, M.D. on 01/08/2019 12:23 PM. Dictated by Particia Jasper, M.D. on 01/08/2019 12:06 PM.         Assessment and Plan:    Problem Incidental Lung Nodule, Greater Than Or Equal to 8mm        Incidental lung nodule, greater than or equal to 8mm  CT chest with improvement in faint infiltrate and no new nodules.  Given smoking history will repeat CT chest without contrast in 12 months.  If unchanged at that time will discontinue further CT chests.

## 2019-01-08 NOTE — Patient Instructions
Clinic Visit Summary:     My nurse is Yamilett Anastos.  You can reach her at [phone]    Please contact the Pulmonary Nurse Coordinator with signs and symptoms of worsening productive cough with thick secretions, blood in sputum, chest tightness/pain, shortness of breath, fever, chills, night sweats, or any questions or concerns.     Pulmonary RN Coordinator - Annetta Deiss, RN at [phone] or fax [phone]     For refills on medications, please have your pharmacy fax a refill authorization request form to our office at (Fax) [phone]. Please allow at least 3 business days for refill requests.     For urgent issues after business hours/weekends/holidays call [phone] and request for the pulmonary fellow to be paged.

## 2019-01-29 ENCOUNTER — Encounter: Admit: 2019-01-29 | Discharge: 2019-01-29

## 2019-01-29 DIAGNOSIS — K253 Acute gastric ulcer without hemorrhage or perforation: Secondary | ICD-10-CM

## 2019-01-29 DIAGNOSIS — Z1159 Encounter for screening for other viral diseases: Secondary | ICD-10-CM

## 2019-02-01 DIAGNOSIS — K210 Gastro-esophageal reflux disease with esophagitis: Secondary | ICD-10-CM

## 2019-02-01 DIAGNOSIS — K253 Acute gastric ulcer without hemorrhage or perforation: Secondary | ICD-10-CM

## 2019-02-23 ENCOUNTER — Encounter: Admit: 2019-02-23 | Discharge: 2019-02-24

## 2019-02-23 DIAGNOSIS — Z01818 Encounter for other preprocedural examination: Secondary | ICD-10-CM

## 2019-02-23 NOTE — Progress Notes
Patient arrived to [facility] for COVID-19 testing 02/23/19 0913. Patient identity confirmed via photo I.D. Nasopharyngeal procedure explained to the patient.   Nasopharyngeal swab completed right  Patient education provided given and instructed patient self isolate until contacted w/ results and further instructions. CDC handout on COVID-19 given to patient.   [email or web address]    Swab collected by Antony Madura, NA.    Date symptoms began/reason for testing: pre op

## 2019-02-24 LAB — COVID-19 (SARS-COV-2) PCR

## 2019-02-25 ENCOUNTER — Encounter: Admit: 2019-02-25 | Discharge: 2019-02-25

## 2019-02-25 ENCOUNTER — Ambulatory Visit: Admit: 2019-02-25 | Discharge: 2019-02-25

## 2019-02-25 NOTE — Progress Notes
Called and offered early appointment to patient, patient unable to come earlier due to the distance of her drive.

## 2019-02-26 ENCOUNTER — Encounter: Admit: 2019-02-26 | Discharge: 2019-02-26

## 2019-02-26 ENCOUNTER — Ambulatory Visit: Admit: 2019-02-26 | Discharge: 2019-02-26

## 2019-02-26 ENCOUNTER — Ambulatory Visit: Admit: 2019-02-26 | Discharge: 2019-02-26 | Payer: MEDICARE

## 2019-02-26 DIAGNOSIS — I10 Essential (primary) hypertension: Secondary | ICD-10-CM

## 2019-02-26 DIAGNOSIS — K222 Esophageal obstruction: Secondary | ICD-10-CM

## 2019-02-26 DIAGNOSIS — Z87891 Personal history of nicotine dependence: Secondary | ICD-10-CM

## 2019-02-26 DIAGNOSIS — J45909 Unspecified asthma, uncomplicated: Secondary | ICD-10-CM

## 2019-02-26 DIAGNOSIS — E785 Hyperlipidemia, unspecified: Secondary | ICD-10-CM

## 2019-02-26 DIAGNOSIS — R1310 Dysphagia, unspecified: Secondary | ICD-10-CM

## 2019-02-26 DIAGNOSIS — K3189 Other diseases of stomach and duodenum: Secondary | ICD-10-CM

## 2019-02-26 DIAGNOSIS — K449 Diaphragmatic hernia without obstruction or gangrene: Secondary | ICD-10-CM

## 2019-02-26 MED ORDER — LACTATED RINGERS IV SOLP
INTRAVENOUS | 0 refills | Status: DC
Start: 2019-02-26 — End: 2019-02-26
  Administered 2019-02-26: 13:00:00 1000.000 mL via INTRAVENOUS

## 2019-02-26 MED ORDER — LIDOCAINE (PF) 200 MG/10 ML (2 %) IJ SYRG
0 refills | Status: DC
Start: 2019-02-26 — End: 2019-02-26
  Administered 2019-02-26: 13:00:00 70 mg via INTRAVENOUS

## 2019-02-26 MED ORDER — FENTANYL CITRATE (PF) 50 MCG/ML IJ SOLN
25-50 ug | INTRAVENOUS | 0 refills | Status: CN | PRN
Start: 2019-02-26 — End: ?

## 2019-02-26 MED ORDER — ONDANSETRON HCL (PF) 4 MG/2 ML IJ SOLN
4 mg | Freq: Once | INTRAVENOUS | 0 refills | Status: CN | PRN
Start: 2019-02-26 — End: ?

## 2019-02-26 MED ORDER — PROPOFOL 10 MG/ML IV EMUL 20 ML (INFUSION)(AM)(OR)
INTRAVENOUS | 0 refills | Status: DC
Start: 2019-02-26 — End: 2019-02-26
  Administered 2019-02-26: 13:00:00 120 ug/kg/min via INTRAVENOUS

## 2019-02-26 MED ORDER — HALOPERIDOL LACTATE 5 MG/ML IJ SOLN
1 mg | Freq: Once | INTRAVENOUS | 0 refills | Status: CN | PRN
Start: 2019-02-26 — End: ?

## 2019-02-26 NOTE — Anesthesia Pre-Procedure Evaluation
Anesthesia Pre-Procedure Evaluation    Name: Karina Flores      MRN: [id number]     DOB: December 11, 1945     Age: 73 y.o.     Sex: female   _________________________________________________________________________     Procedure Info:   Procedure Information     Date/Time:  02/26/19 0820    Procedure:  EGD (N/A )    Location:  ENDO 4 / ENDO/GI    Surgeon:  Meyer Cory, MD          Physical Assessment  Vital Signs (last filed in past 24 hours):         Patient History   Allergies   Allergen Reactions   ??? Oxycontin [Oxycodone] HALLUCINATIONS        Current Medications    Medication Directions   albuterol 0.5% (PROVENTIL) 2.5 mg/0.5 mL nebulizer solution Inhale 2.5 mg solution by nebulizer as directed every 6 hours as needed for Shortness of Breath or Wheezing.   aspirin EC 81 mg tablet Take 81 mg by mouth daily. Take with food.   benzocaine/menthol (CHLORASEPTIC) 6-10 mg lozg lozenge Take one lozenge by mouth every 2 hours as needed.   calcium carbonate (TUMS) 500 mg (200 mg elemental calcium) chewable tablet Chew 500 mg by mouth daily.   cetirizine (ZYRTEC) 10 mg tablet Take 10 mg by mouth every morning.   hydroCHLOROthiazide (HYDRODIURIL) 12.5 mg tablet Take 12.5 mg by mouth every morning.   losartan (COZAAR) 50 mg tablet Take 50 mg by mouth daily.   omeprazole DR (PRILOSEC) 40 mg capsule Take one capsule by mouth daily before breakfast.   pantoprazole DR (PROTONIX) 40 mg tablet Take one tablet by mouth twice daily.   polyethylene glycol 3350 (MIRALAX) 17 g packet Take one packet by mouth daily as needed.   rosuvastatin (CRESTOR) 5 mg tablet Take 5 mg by mouth daily.         Review of Systems/Medical History      Patient summary reviewed  Nursing notes reviewed  Pertinent labs reviewed    PONV Screening: Female gender, Non-smoker and Postoperative opioids  No history of anesthetic complications  No family history of anesthetic complications      Airway - negative        Pulmonary Not a current smoker (60PPY smoking history, quit 5 years ago)        Asthma (As child. Sometimes flares up seasonally)    COPD (h/o bronchitis)      lung nodule      Cardiovascular         Exercise tolerance: >4 METS      Beta Blocker therapy: No      Beta blockers within 24 hours: n/a        Hypertension, well controlled      Hyperlipidemia      GI/Hepatic/Renal         Hiatal hernia      GERD,       Dysphagia. 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    Abdominal Findings:         Abdominal exam deferred    Neurological Findings:       Alert and oriented x 3    Constitutional findings:       No acute distress       Diagnostic Tests  Hematology:   Lab Results   Component Value Date    HGB 13.8 09/22/2018    HCT 41.6 09/22/2018    PLTCT 224 09/22/2018    WBC 7.1 09/22/2018    NEUT 64 09/22/2018    ANC 4.60 09/22/2018    ALC 1.70 09/22/2018    MONA 9 09/22/2018    AMC 0.60 09/22/2018    EOSA 2 09/22/2018    ABC 0.10 09/22/2018    MCV 96.6 09/22/2018    MCH 32.1 09/22/2018    MCHC 33.3 09/22/2018    MPV 7.7 09/22/2018    RDW 13.2 09/22/2018         General Chemistry:   Lab Results   Component Value Date    NA 141 09/22/2018    K 4.5 09/22/2018    CL 106 09/22/2018    CO2 27 09/22/2018    GAP 8 09/22/2018    BUN 25 09/22/2018    CR 1.0 01/08/2019    CR 0.92 09/22/2018    GLU 97 09/22/2018    CA 8.9 09/22/2018    ALBUMIN 3.7 09/22/2018    MG 2.0 09/20/2018    TOTBILI 0.5 09/22/2018 Coagulation:   Lab Results   Component Value Date    PTT 27.3 09/21/2018    INR 1.0 09/21/2018         Anesthesia Plan    ASA score: 3   Plan: MAC  Induction method: intravenous  NPO status: acceptable      Informed Consent  Anesthetic plan and risks discussed with patient.        Plan discussed with: CRNA and anesthesiologist.

## 2019-02-26 NOTE — Anesthesia Post-Procedure Evaluation
Post-Anesthesia Evaluation    Name: Karina Flores      MRN: [id number]     DOB: 14-Sep-1945     Age: 73 y.o.     Sex: female   __________________________________________________________________________     Procedure Information     Anesthesia Start Date/Time:  02/26/19 0817    Procedure:  EGD (N/A )    Location:  ENDO 4 / ENDO/GI    Surgeon:  Roslynn Amble, MD          Post-Anesthesia Vitals  BP: 115/64 (09/11 0850)  Temp: 36.5 C (97.7 F) (09/11 0848)  Pulse: 76 (09/11 0850)  Respirations: 17 PER MINUTE (09/11 0850)  SpO2: 99 % (09/11 0850)  SpO2 Pulse: 76 (09/11 0850)   Vitals Value Taken Time   BP 115/64 02/26/2019  8:50 AM   Temp 36.5 C (97.7 F) 02/26/2019  8:48 AM   Pulse 76 02/26/2019  8:50 AM   Respirations 17 PER MINUTE 02/26/2019  8:50 AM   SpO2 99 % 02/26/2019  8:50 AM         Post Anesthesia Evaluation Note    Evaluation location: Pre/Post  Patient participation: recovered; patient participated in evaluation  Level of consciousness: alert    Pain score: 0  Pain management: adequate    Hydration: normovolemia  Temperature: 36.0C - 38.4C  Airway patency: adequate    Perioperative Events       Post-op nausea and vomiting: no PONV    Postoperative Status  Cardiovascular status: hemodynamically stable  Respiratory status: spontaneous ventilation        Perioperative Events  Perioperative Event: No  Emergency Case Activation: No

## 2019-02-26 NOTE — H&P (View-Only)
Pre Procedure History and Physical/Sedation Plan    Name:Karina Flores                                                                   MRN: [id number]                 DOB:Jan 19, 1946          Age: 73 y.o.  Date of Service: 02/26/2019     Date of Procedure:  02/26/2019    Planned Procedure(s):  GI:  EGD  Sedation/Medication Plan: MAC (Monitored Anesthesia Care)  Discussion/Reviews:  Physician has discussed risks and alternatives of this type of sedation and above planned procedures with patient  ___________________________________________________________________  Chief Complaint:  Reflux esophagitis    History of Present Illness: Karina Flores is a 73 y.o. female     Previous Anesthetic/Sedation History:  Per anesthesia    Medical History:   Diagnosis Date   ??? Asthma    ??? HLD (hyperlipidemia)    ??? HTN (hypertension)      Surgical History:   Procedure Laterality Date   ??? EGD N/A 09/21/2018    Performed by Samuel Jester, MD at [facility] ENDO   ??? ESOPHAGOGASTRODUODENOSCOPY WITH BIOPSY - FLEXIBLE  09/21/2018    Performed by Samuel Jester, MD at [facility] ENDO   ??? ESOPHAGOGASTRODUODENOSCOPY WITH BIOPSY - FLEXIBLE N/A 12/04/2018    Performed by Tim Lair, MD at [facility] ENDO     Pertinent medical/surgical history reviewed  Pertinent family history reviewed  Social History     Tobacco Use   ??? Smoking status: Former Smoker     Packs/day: 1.00     Years: 50.00     Pack years: 50.00     Types: Cigarettes     Last attempt to quit: 2015     Years since quitting: 5.6   ??? Smokeless tobacco: Never Used   Substance Use Topics   ??? Alcohol use: Yes     Alcohol/week: 7.0 standard drinks     Types: 7 Glasses of wine per week     Drinks per session: 1 or 2     Binge frequency: Never   ??? Drug use: Never     Social History     Substance and Sexual Activity   Drug Use Never     Allergies:  Oxycontin [oxycodone]  Medications  Current Facility-Administered Medications   Medication   ??? lactated ringers infusion     Review of Systems: All other systems reviewed and are negative.           Physical Exam:  Temp: 36.8 ???C (98.3 ???F) (09/11 5284)  Pulse: 90 (09/11 0718)  Respirations: 17 PER MINUTE (09/11 0718)  BP: 148/73 (09/11 0718)    Abd S, NT, +ve BS    Lab/Radiology/Other Diagnostic Tests  Labs:  24-hour labs:  No results found for this visit on 02/26/19 (from the past 24 hour(s)).      Meyer Cory, MD  Pager

## 2019-02-26 NOTE — Discharge Instructions
Diet after Procedure:    Please return to your previous diet as tolerated.

## 2019-02-26 NOTE — Discharge Planning (AHS/AVS)
EGD/Upper EUS/ERCP/Antegrade Enteroscopy  Post Upper Endoscopy Instructions    -You may have a sore throat after the procedure for 2-3 days.  Try sucrets or lozenges to help ease the pain.  If it continues please contact us.    -If you feel feverish, have a temperature of 101 degrees or higher, persistent nausea and vomiting, abdominal pain or dark stools; please notify your nurse or GI physician.    -You may have abdominal cramping following the procedure this can be relieved by belching or passing air.    -If you have redness or swelling at the IV site, place a warm, wet washcloth over the affected areas for 15 minutes, 3-4 times a day until the redness subsides.  If symptoms continue for 2-3 days, contact your regular physician.    - If you have bleeding from your mouth, over 2 tablespoons and increasing, please notify your physician.  A small amount of bleeding is normal if a biopsy or polyps were taken.  If you are vomiting blood you need to seek immediate medical attention.    - You may resume all your routine medications, if medications need to be held your physician and/or nurse will notify you post procedure.    SPECIFIC INSTRUCTIONS  INPATIENTS:  Ask for help when you get up in your room, as you may still be drowsy from your sedation.    OUTPATIENTS:  A. Because of sedation and lack of coordination, FOR THE NEXT 24 HOURS, DO NOT:  1. Operate any motorized vehicle - this includes driving.  2. Sign any legal documents or conduct important business matters.  3. Use any dangerous machinery (chain saw, lawnmower, etc.).  4. Drink any alcoholic beverages.  Should you have any questions or concerns after your procedure please call [phone] M-F 8am-5:00 pm. After 5:00 pm, holidays or weekends call [phone] and ask for the GI Doctor on call.

## 2019-02-27 ENCOUNTER — Encounter: Admit: 2019-02-27 | Discharge: 2019-02-27

## 2019-02-27 DIAGNOSIS — E785 Hyperlipidemia, unspecified: Secondary | ICD-10-CM

## 2019-02-27 DIAGNOSIS — I10 Essential (primary) hypertension: Secondary | ICD-10-CM

## 2019-02-27 DIAGNOSIS — J45909 Unspecified asthma, uncomplicated: Secondary | ICD-10-CM

## 2019-03-02 ENCOUNTER — Encounter: Admit: 2019-03-02 | Discharge: 2019-03-02 | Payer: MEDICARE

## 2019-05-27 ENCOUNTER — Ambulatory Visit: Admit: 2019-05-27 | Discharge: 2019-05-28 | Payer: MEDICARE

## 2019-05-27 ENCOUNTER — Encounter: Admit: 2019-05-27 | Discharge: 2019-05-27 | Payer: MEDICARE

## 2019-05-27 DIAGNOSIS — J45909 Unspecified asthma, uncomplicated: Secondary | ICD-10-CM

## 2019-05-27 DIAGNOSIS — E785 Hyperlipidemia, unspecified: Secondary | ICD-10-CM

## 2019-05-27 DIAGNOSIS — K21 Gastroesophageal reflux disease with esophagitis without hemorrhage: Secondary | ICD-10-CM

## 2019-05-27 DIAGNOSIS — Z1211 Encounter for screening for malignant neoplasm of colon: Secondary | ICD-10-CM

## 2019-05-27 DIAGNOSIS — I10 Essential (primary) hypertension: Secondary | ICD-10-CM

## 2019-05-27 DIAGNOSIS — R1310 Dysphagia, unspecified: Secondary | ICD-10-CM

## 2019-05-27 DIAGNOSIS — K449 Diaphragmatic hernia without obstruction or gangrene: Secondary | ICD-10-CM

## 2019-05-27 MED ORDER — OMEPRAZOLE 20 MG PO CPDR
20 mg | ORAL_CAPSULE | Freq: Every day | ORAL | 11 refills | Status: AC
Start: 2019-05-27 — End: ?

## 2019-05-27 NOTE — Progress Notes
GI Clinic Note:      New Patient GI Office Visit    Referring Provider: Ozzie Hoyle      Reason for Visit/CC:    Reflux esophagitis and dysphagia    History of Present Illness     73 year old pleasant female with reflux esophagitis and dysphagia this year for further management.    Index Visit Hx 12.10.20    Predominant symptom(s): Dysphagia that has resolved.  She underwent an EGD in April for dysphagia and abnormal CT imaging concerning for distal esophageal mass.  She was found to have long deep linear ulcerations in her distal esophagus with biopsies negative for malignancy, in addition to gastric erosions with biopsy negative for H. pylori.  She underwent a repeat EGD in June while on PPI which revealed LA-D esophagitis but the ulceration appeared improved from prior EGD.  I did her EGD myself on 9.11.20 follow-up of her esophagitis while on omeprazole 40 mg daily and she denied having dysphagia at that time. The EGD showed that her esophagitis has healed and a distal peptic stricture was found status post balloon dilation to 15 mm with minor mucosal breaks in the stricture area.    Today she is doing very well on omeprazole 40 mg with no dysphagia, heartburn, or other reflux symptoms.  She is scheduled to have a colonoscopy locally.  She has never had a colonoscopy before.      Review of Systems   HENT: Positive for congestion.    Respiratory: Positive for shortness of breath.    Gastrointestinal: Positive for diarrhea.   Allergic/Immunologic: Positive for environmental allergies.   Hematological: Bruises/bleeds easily.   Psychiatric/Behavioral: Positive for sleep disturbance.   All other systems reviewed and are negative.       Medical History:   Diagnosis Date   ? Asthma    ? HLD (hyperlipidemia)    ? HTN (hypertension)      Surgical History:   Procedure Laterality Date   ? EGD N/A 09/21/2018    Performed by Samuel Jester, MD at [facility] ENDO   ? ESOPHAGOGASTRODUODENOSCOPY WITH BIOPSY - FLEXIBLE  09/21/2018 Performed by Samuel Jester, MD at [facility] ENDO   ? ESOPHAGOGASTRODUODENOSCOPY WITH BIOPSY - FLEXIBLE N/A 12/04/2018    Performed by Tim Lair, MD at [facility] ENDO   ? EGD N/A 02/26/2019    Performed by Meyer Cory, MD at [facility] ENDO     Family History   Problem Relation Age of Onset   ? Cancer-Lung Father    ? Esophageal Cancer Maternal Grandfather      Social History     Socioeconomic History   ? Marital status: Married     Spouse name: Jonny Ruiz   ? Number of children: 1   ? Years of education: Not on file   ? Highest education level: Not on file   Occupational History   ? Not on file   Tobacco Use   ? Smoking status: Former Smoker     Packs/day: 1.00     Years: 50.00     Pack years: 50.00     Types: Cigarettes     Quit date: 2015     Years since quitting: 5.9   ? Smokeless tobacco: Never Used   Substance and Sexual Activity   ? Alcohol use: Yes     Alcohol/week: 7.0 standard drinks     Types: 7 Glasses of wine per week     Drinks per session: 1 or 2  Binge frequency: Never   ? Drug use: Never   ? Sexual activity: Not on file   Other Topics Concern   ? Not on file   Social History Narrative   ? Not on file        Current Outpatient Medications on File Prior to Visit   Medication Sig Dispense Refill   ? albuterol 0.5% (PROVENTIL) 2.5 mg/0.5 mL nebulizer solution Inhale 2.5 mg solution by nebulizer as directed every 6 hours as needed for Shortness of Breath or Wheezing.     ? aspirin EC 81 mg tablet Take 81 mg by mouth daily. Take with food.     ? benzocaine/menthol (CHLORASEPTIC) 6-10 mg lozg lozenge Take one lozenge by mouth every 2 hours as needed.     ? calcium carbonate (TUMS) 500 mg (200 mg elemental calcium) chewable tablet Chew 500 mg by mouth daily.     ? cetirizine (ZYRTEC) 10 mg tablet Take 10 mg by mouth every morning.     ? hydroCHLOROthiazide (HYDRODIURIL) 12.5 mg tablet Take 12.5 mg by mouth every morning.     ? losartan (COZAAR) 50 mg tablet Take 50 mg by mouth daily. ? omeprazole DR (PRILOSEC) 40 mg capsule Take one capsule by mouth daily before breakfast. 30 capsule 5   ? pantoprazole DR (PROTONIX) 40 mg tablet Take one tablet by mouth twice daily. 120 tablet 0   ? polyethylene glycol 3350 (MIRALAX) 17 g packet Take one packet by mouth daily as needed. 12 each    ? rosuvastatin (CRESTOR) 5 mg tablet Take 5 mg by mouth daily.       No current facility-administered medications on file prior to visit.        Objectives:  Vitals:    05/27/19 0932   BP: 104/79   Pulse: 84   Temp: 36.6 ?C (97.8 ?F)          Physical Exam  General: Alert and oriented, no acute distress  HEENT: OP clear, MMM  Neck: No masses, normal laryngeal elevation with simulated swallow  Chest: non-tender to palpation  Heart: RRR, S1&S2 nl  Lungs: CTAB/L  Abdomen: S, NT, ND, NABS  Neuro: A&Ox3, CNs grossly intact, no focal deficits  Extremities: MAE spontaneously, no edema  Skin: no rash or skin jaundice  MSK: normal ROM      Labs, diagnostic testing, and available outside records personally reviewed       Assessment / Plan:  -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  Esophagitis healed with omeprazole 40 mg daily and I dilated her stricture to 15 mm with minor mucosal breaks.  Currently there is no dysphagia or any other reflux symptoms.    We will decrease her omeprazole 20 mg daily and to be on it indefinitely given her prior complicated reflux disease on endoscopy.  If she develops symptoms then we will plan to put her back on 40 mg of omeprazole, and if symptoms persist then we will arrange for an endoscopy for evaluation.    -HCV screening: Can be checked locally with her PCP    -Colon cancer screening: Never had a colonoscopy, she is planned to have a colonoscopy locally where she lives.    Goals of management discussed with patient. The goal is to use of the lowest effective PPI dose.    Esophageal questionnaire completed today. RTC PRN Total of 30 minutes spent on this patient's encounter with counseling and coordination of care taking >50% of the visit. Patient was  alone during the encounter.    Meyer Cory MD, MS  GI attending

## 2020-01-06 ENCOUNTER — Encounter: Admit: 2020-01-06 | Discharge: 2020-01-06 | Payer: MEDICARE

## 2020-01-06 ENCOUNTER — Ambulatory Visit: Admit: 2020-01-06 | Discharge: 2020-01-06 | Payer: MEDICARE

## 2020-01-06 DIAGNOSIS — I10 Essential (primary) hypertension: Secondary | ICD-10-CM

## 2020-01-06 DIAGNOSIS — E785 Hyperlipidemia, unspecified: Secondary | ICD-10-CM

## 2020-01-06 DIAGNOSIS — J45909 Unspecified asthma, uncomplicated: Secondary | ICD-10-CM

## 2020-01-06 DIAGNOSIS — R911 Solitary pulmonary nodule: Secondary | ICD-10-CM

## 2020-01-07 ENCOUNTER — Encounter: Admit: 2020-01-07 | Discharge: 2020-01-07 | Payer: MEDICARE

## 2020-01-07 NOTE — Telephone Encounter
Called both home and cell phone numbers.  Left VMs for her to contact Deana or I will try later today to review her CT scan results.

## 2020-01-11 ENCOUNTER — Encounter: Admit: 2020-01-11 | Discharge: 2020-01-11 | Payer: MEDICARE

## 2020-03-19 IMAGING — CR CHEST
2 series · 2 of 2 positions shown · non-contrast
Comparison: none

[chest pa x-wise]
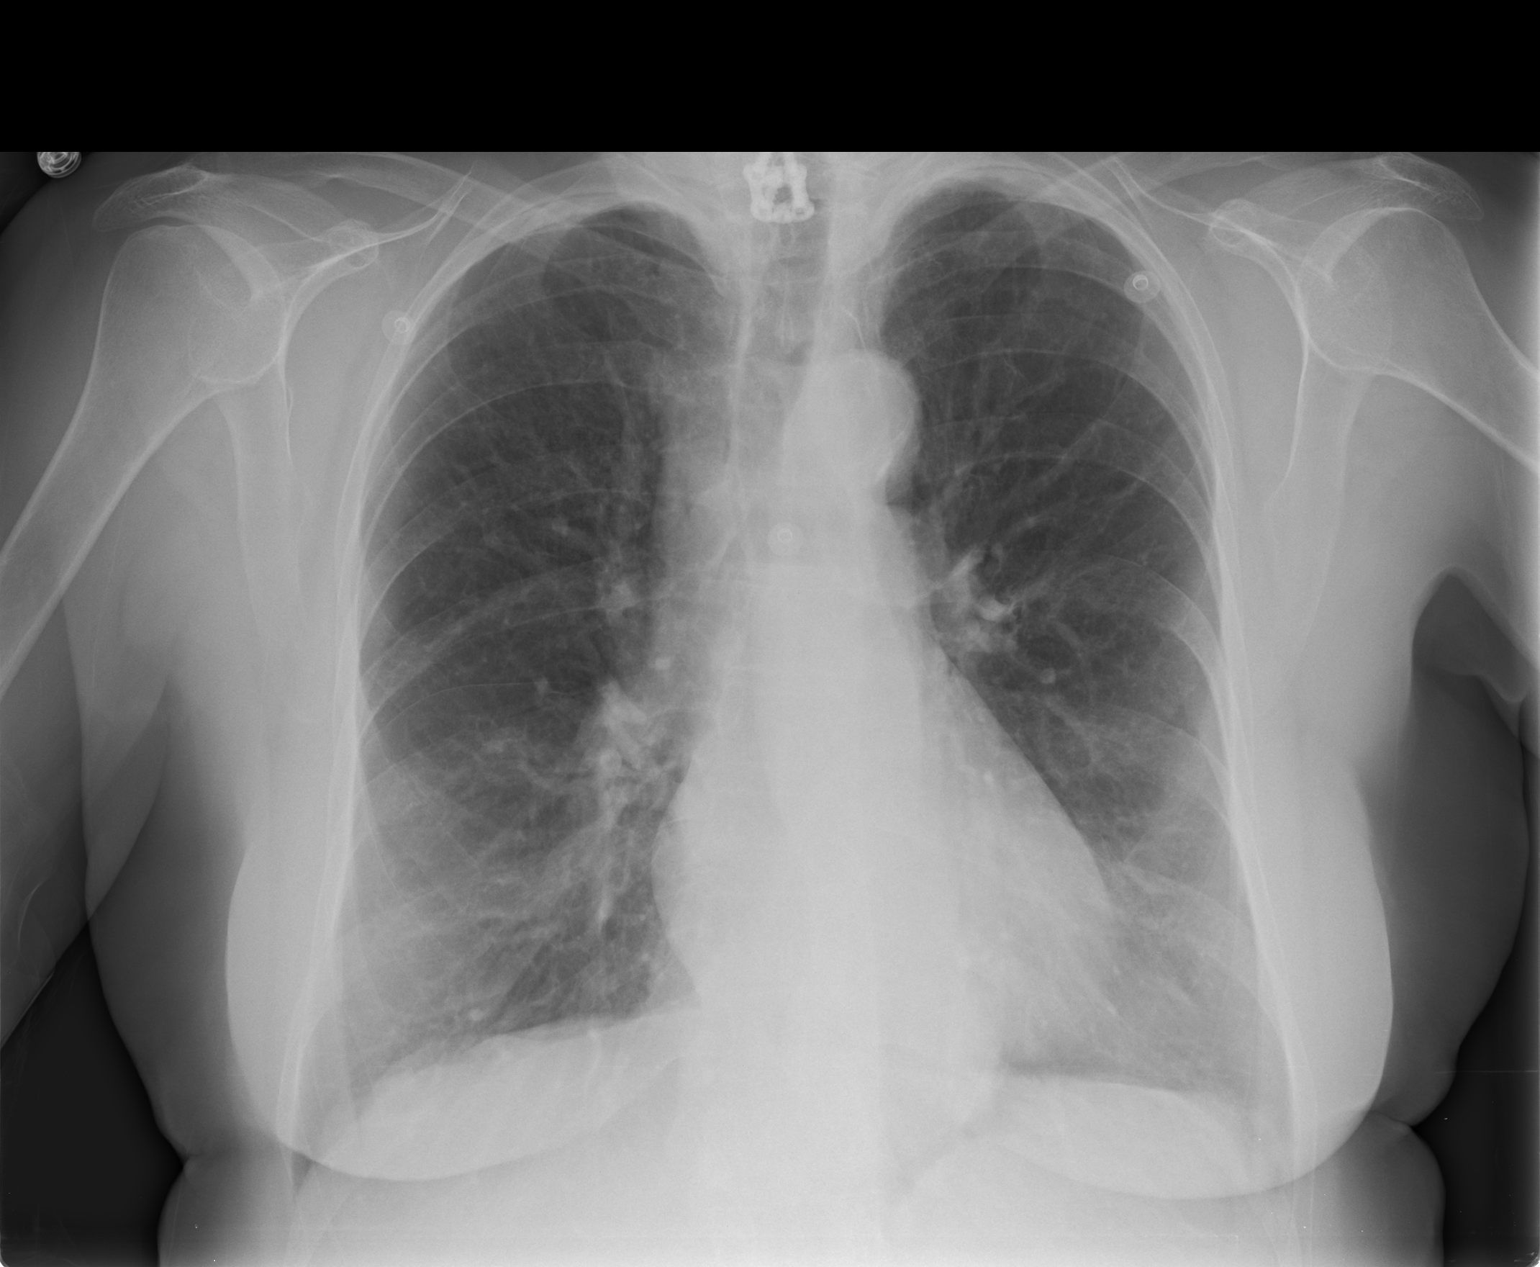

[chest lat]
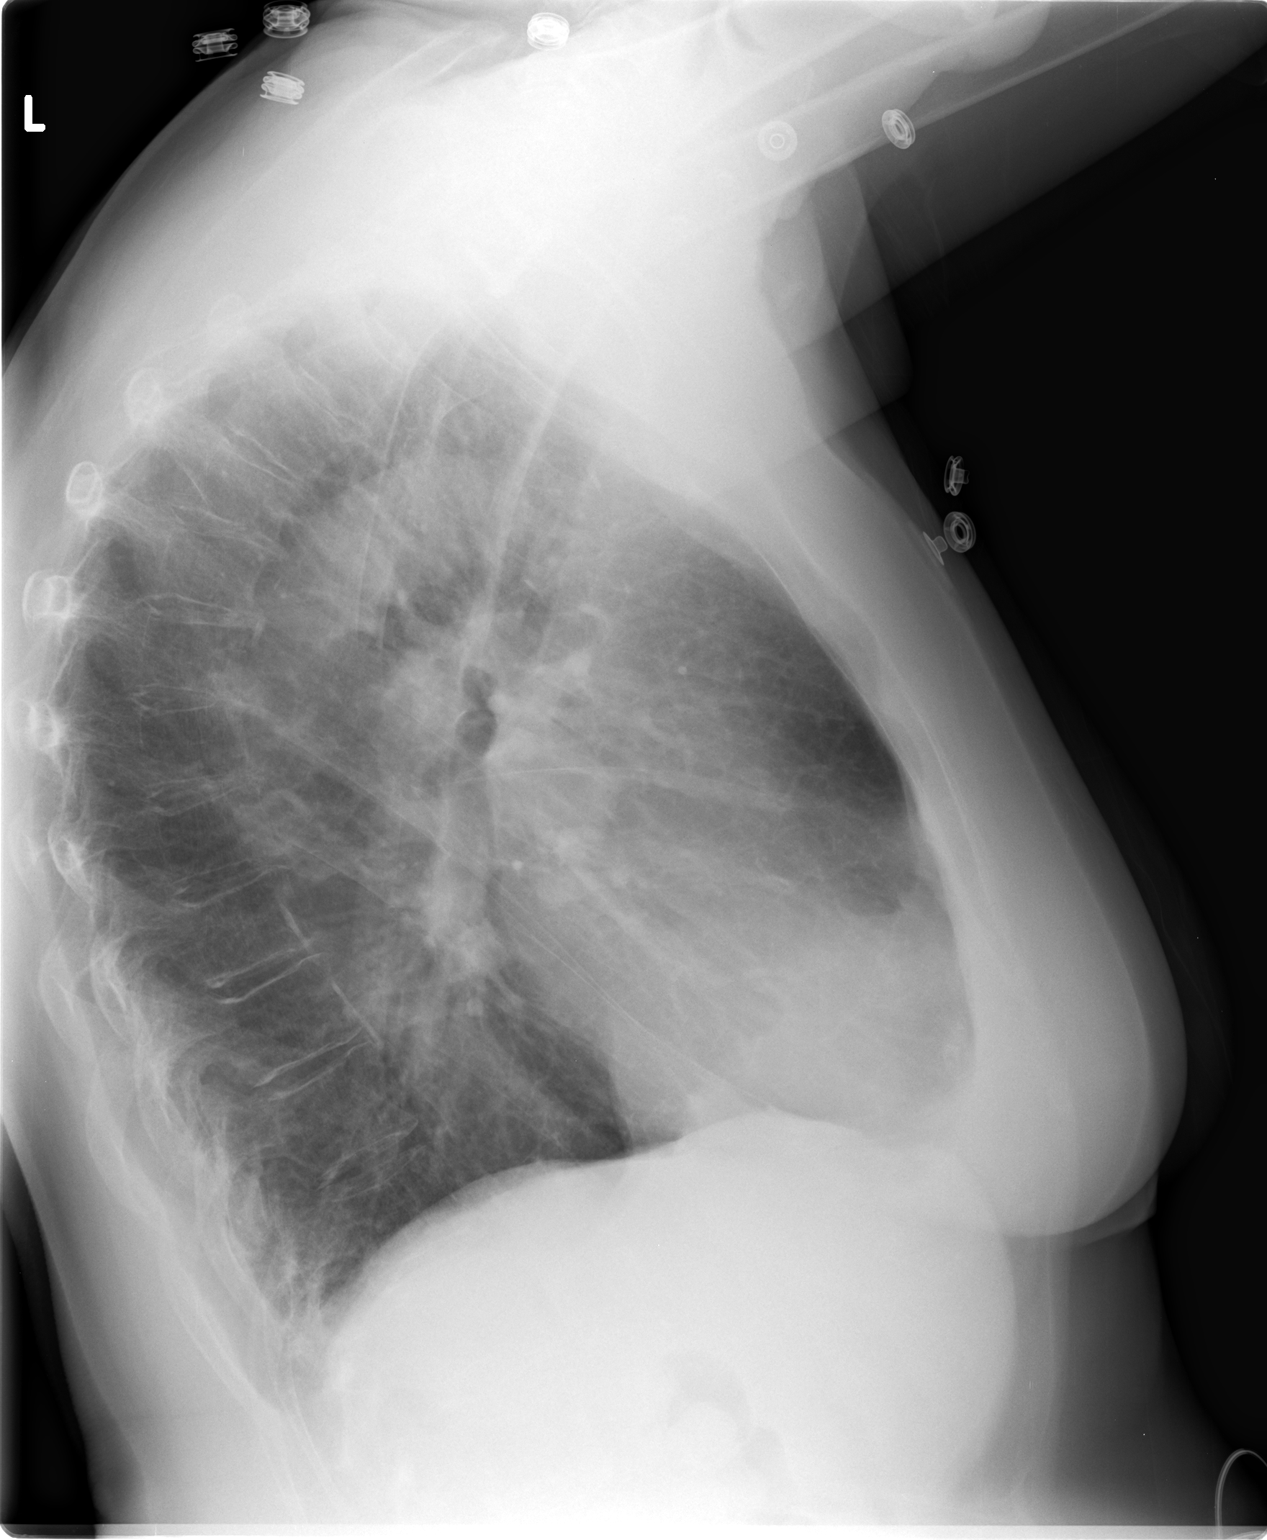

[2 of 2 positions shown; findings below may reference images not displayed]

DIAGNOSTIC STUDIES

EXAM
RADIOLOGICAL EXAMINATION, CHEST; 2 VIEWS FRONTAL AND LATERAL CPT 98494

INDICATION
chest pain
MID STERNAL HEARTBURN LIKE CHEST PAIN, WORSE WITH SWALLOWING. PAIN WORSE SINCE GIVEN ORAL MEDS IN
ER. NECK SX.

TECHNIQUE
Frontal and lateral views of the chest were performed.

COMPARISONS
06/28/2016

FINDINGS
[The heart appears normal in size. Mild bilateral hilar prominence. Mild peribronchial thickening.
No dense consolidation. No acute fracture.

IMPRESSION
1. Mild peribronchial thickening. No dense consolidation.

Tech Notes:

MID STERNAL HEARTBURN LIKE CHEST PAIN, WORSE WITH SWALLOWING.  PAIN WORSE SINCE GIVEN ORAL MEDS IN
ER.  NECK SX.

## 2020-03-19 IMAGING — CT CHESTW
1 of 3 series · 12 of 25 positions shown, 15 images · non-contrast
Comparison: none

[Series 5: thorax ax 5.00 br60 s3 · axial · 0.57mm/px · z∈[+1526,+1791]mm · 12 of 19 slices shown, 15 images]
[im 2/19  mediastinal]
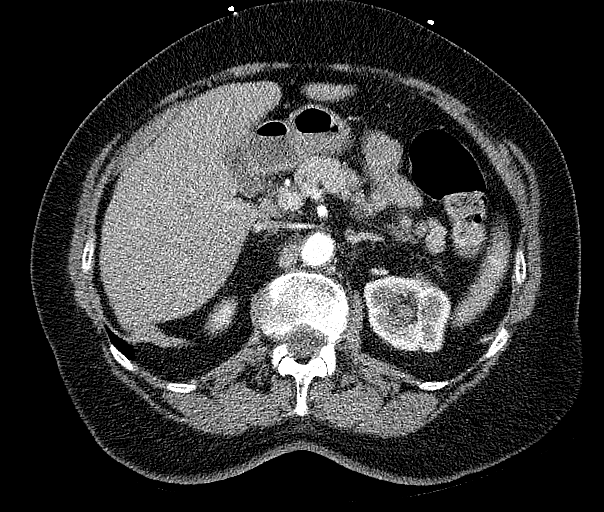
[im 2/19  lung]
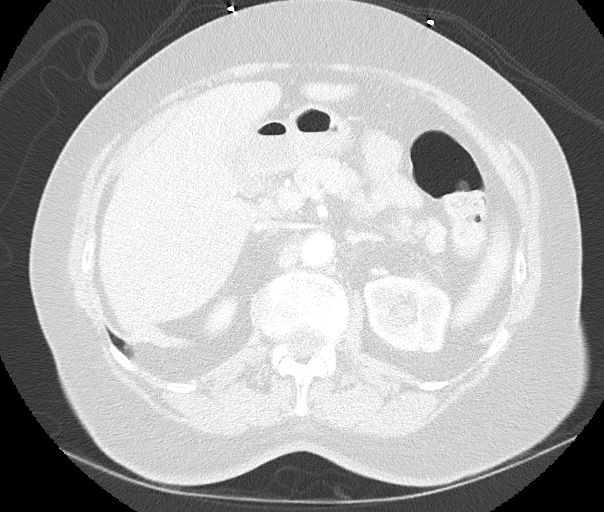
[im 4/19  lung]
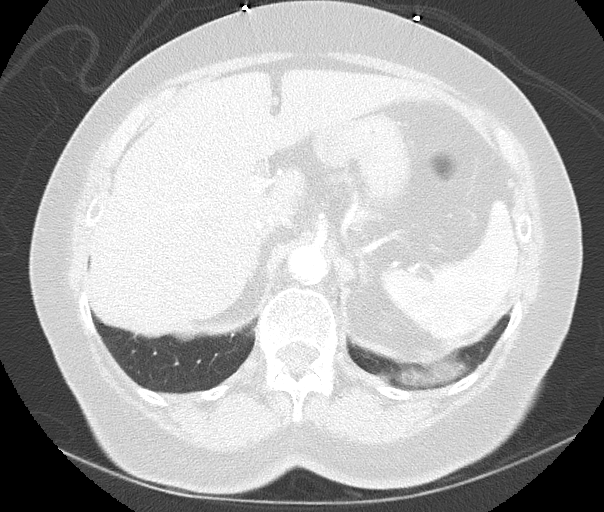
[im 5/19  lung]
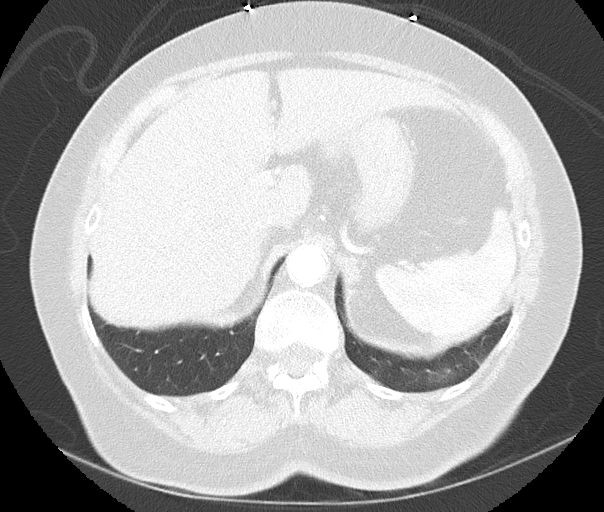
[im 6/19  lung]
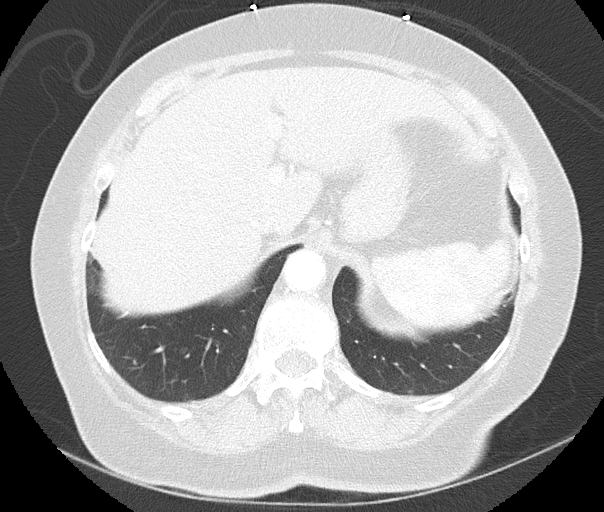
[im 8/19  mediastinal]
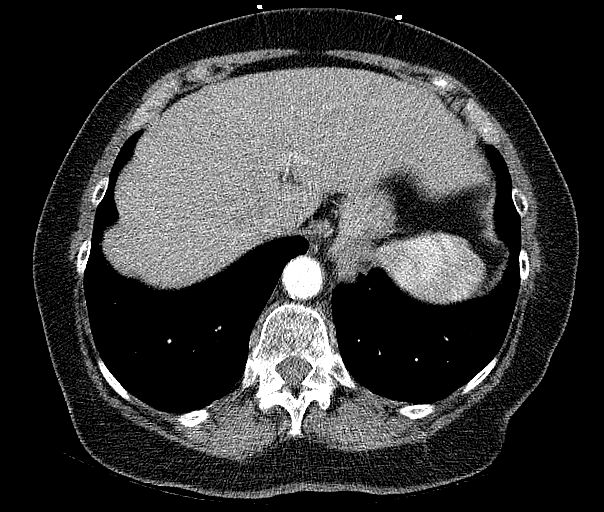
[im 8/19  lung]
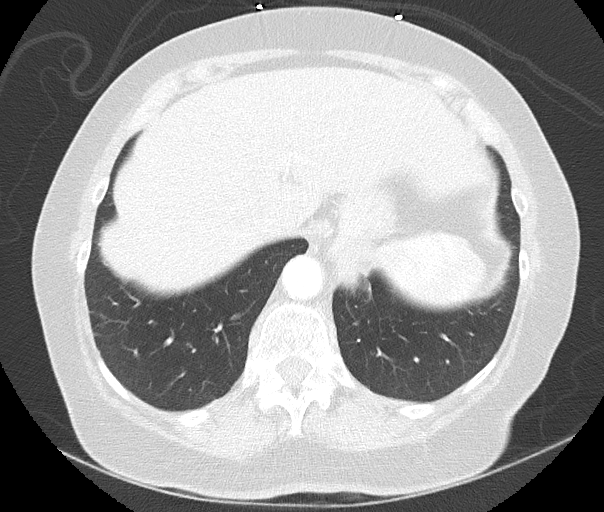
[im 9/19  lung]
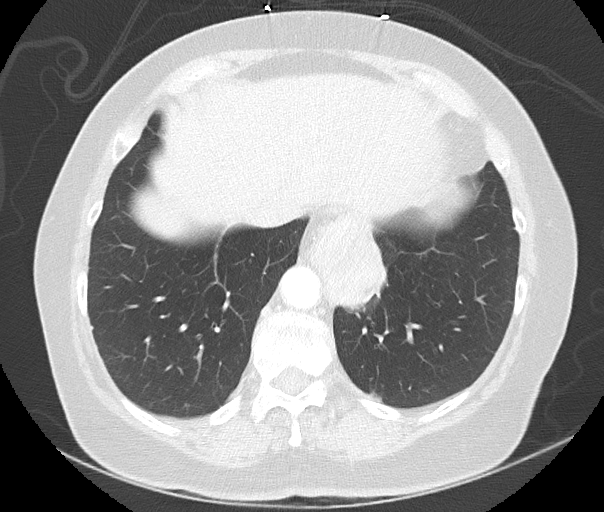
[im 11/19  lung]
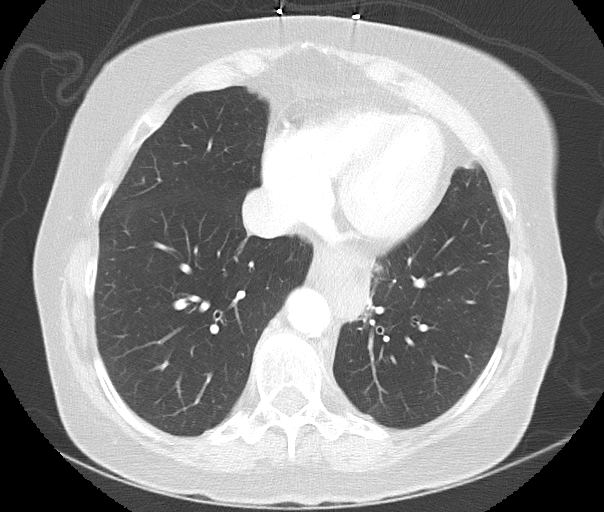
[im 12/19  lung]
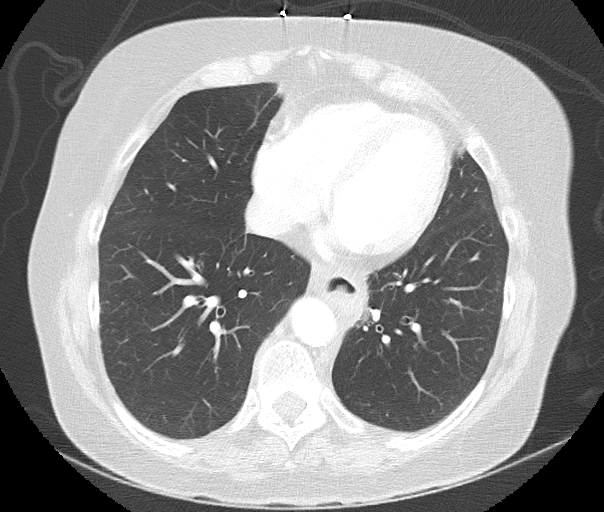
[im 14/19  mediastinal]
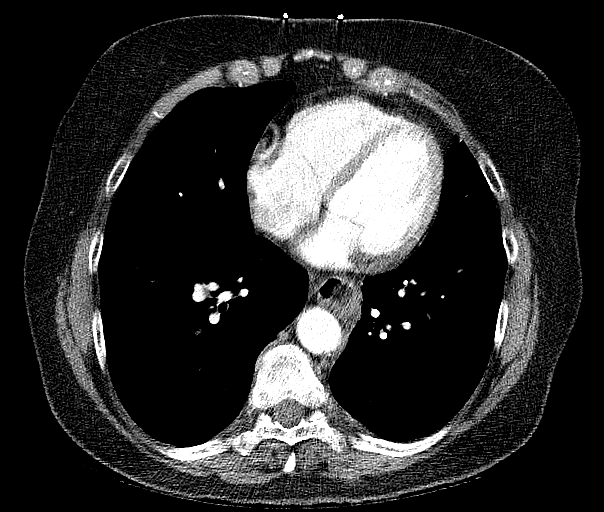
[im 14/19  lung]
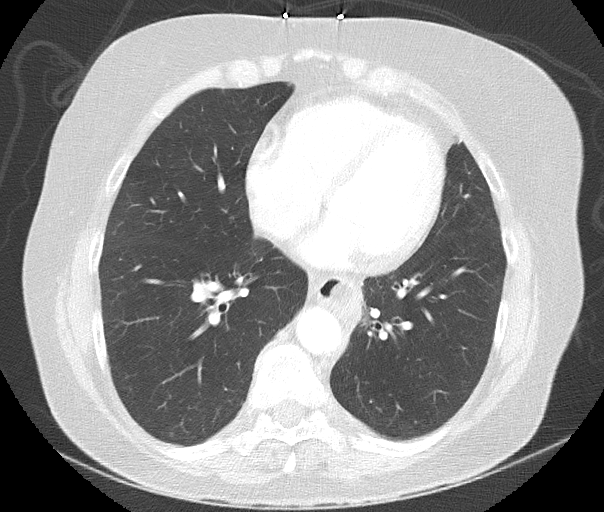
[im 15/19  lung]
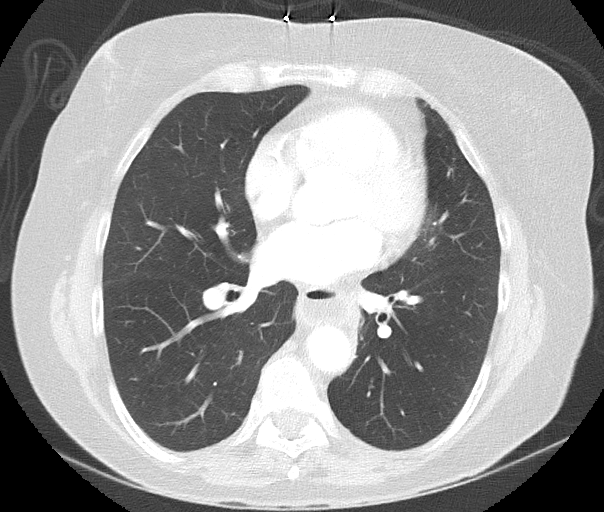
[im 16/19  lung]
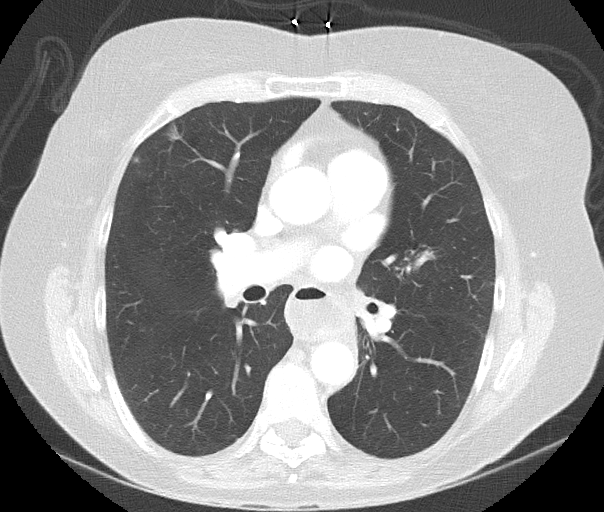
[im 18/19  lung]
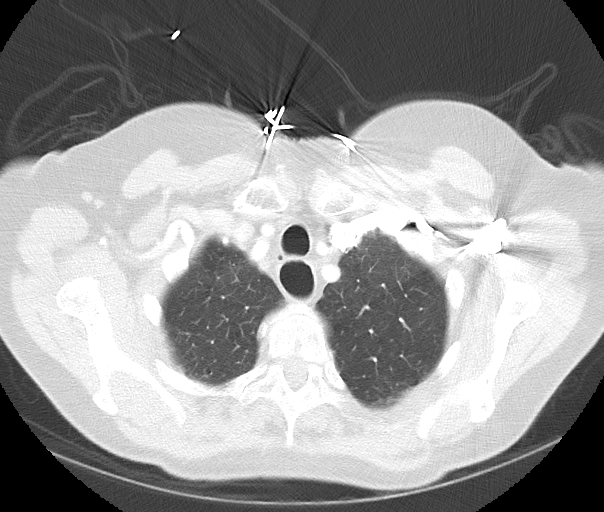

[12 of 25 positions shown; findings below may reference images not displayed]

DIAGNOSTIC STUDIES

EXAM
COMPUTED TOMOGRAPHY, THORAX WITH CONTRAST MATERIAL CPT 02865; COMPUTED TOMOGRAPHY, ABDOMEN AND
PELVIS WITH CONTRAST MATERIAL CPT 21622

INDICATION
can't swallow without severe pain
PAIN WITH SWALLOWING SINCE NOON YESTERDAY. PT DRANK GI COCKTAIL AND PAIN INCREASED. MID STERNAL. NO
A/P SX, NO CHEST SX 75 ML OMNIPAQUE 300 CREAT 1.09, GFR 54.

TECHNIQUE
Contiguous axial tomographic images were obtained from the lung apices to the symphysis pubis with
both oral and intravenous contrast. Coronal and sagittal reformatted imaging was performed.
All CT scans at this facility use dose modulation, iterative reconstruction, and/or weight based
dosing when appropriate to reduce radiation dose to as low as reasonably achievable.
0 CT and nuclear scans in the last year.

COMPARISONS
No priors available for comparison.

FINDINGS
Chest coal the heart appears normal in size. No pericardial effusion. No central pulmonary
embolism. There is a 1.3 centimeter right hilar node. Mild dilatation of the esophagus with an
internal air-fluid level. There is a 4.4 x 5.7 centimeter mass involving the distal esophagus. No
pleural effusion. 2 millimeter nodule within the left upper lobe, image 26. 2 millimeter right upper
lobe nodule, image 19. 2 millimeter right middle lobe nodule, image 34. 2 millimeter right middle
lobe nodule, image 30. 3 millimeter right middle lobe nodule, image 40.
Abdomen and pelvis: The liver, spleen, pancreas, gallbladder and adrenal glands appear grossly
unremarkable. No hydronephrosis. No bowel obstruction or free air. Mild gastric mucosal thickening.
Moderate to severe atherosclerotic disease. Subcentimeter retroperitoneal nodes. The urinary bladder
is incompletely distended. The uterus and adnexa appear grossly unremarkable.
Musculoskeletal: Moderate degenerative changes at the level of L3/4. Grade 1 spondylolisthesis at
the level of L4. Fusion within the lower cervical spine.

IMPRESSION
1. There is a 4.4 x 5.7 centimeter mass involving the distal esophagus with at least a partial
esophageal obstruction.
2. There is a 1.3 centimeter right hilar node. There are multiple small pulmonary nodules,
measuring up to 3 millimeters. These findings are nonspecific.
The above findings are consistent with an esophageal carcinoma. Follow-up with a stat
gastroenterology consultation and elective PET-CT is recommended.

Tech Notes:

PAIN WITH SWALLOWING SINCE NOON YESTERDAY.  PT DRANK GI COCKTAIL AND PAIN INCREASED.  MID STERNAL.
NO A/P SX, NO CHEST SX   75 ML OMNIPAQUE 300 CREAT 1.09, GFR 54.

## 2023-07-25 ENCOUNTER — Encounter: Admit: 2023-07-25 | Discharge: 2023-07-25 | Payer: MEDICARE

## 2023-11-05 ENCOUNTER — Encounter: Admit: 2023-11-05 | Discharge: 2023-11-05 | Payer: MEDICARE

## 2023-11-05 DIAGNOSIS — M545 Lumbar pain: Secondary | ICD-10-CM

## 2023-11-06 ENCOUNTER — Encounter: Admit: 2023-11-06 | Discharge: 2023-11-06 | Payer: MEDICARE

## 2023-11-09 ENCOUNTER — Encounter: Admit: 2023-11-09 | Discharge: 2023-11-09 | Payer: MEDICARE

## 2023-11-11 ENCOUNTER — Encounter: Admit: 2023-11-11 | Discharge: 2023-11-11 | Payer: MEDICARE

## 2023-11-12 ENCOUNTER — Encounter: Admit: 2023-11-12 | Discharge: 2023-11-12 | Payer: MEDICARE

## 2023-11-17 ENCOUNTER — Encounter: Admit: 2023-11-17 | Discharge: 2023-11-17 | Payer: MEDICARE

## 2023-11-17 ENCOUNTER — Ambulatory Visit: Admit: 2023-11-17 | Discharge: 2023-11-17 | Payer: MEDICARE

## 2023-11-17 DIAGNOSIS — M48062 Spinal stenosis, lumbar region with neurogenic claudication: Secondary | ICD-10-CM

## 2023-11-17 DIAGNOSIS — M810 Age-related osteoporosis without current pathological fracture: Secondary | ICD-10-CM

## 2023-11-17 NOTE — Progress Notes
 [facility] HISTORY AND PHYSICAL    Chief Complaint   Patient presents with    Lower Back - New Patient       Subjective     Chief complaint: Left leg pain.    History of present illness: 78 year old female seen the office today for evaluation of her lumbar spine.  She has a 2-year long history of problems with her back and left leg getting any worse that period of time.  Her #1 complaint today is really the pain in the buttocks down the posterior aspect of the left leg into the top of her foot.  The symptoms are worse to stand or walking quite a bit better if she is able to sit.  She really minimizes her back pain to me today and really does not describe any significant right leg symptoms.  She has been through extensive conservative treatment including epidural steroid injections.  The initial set of injections helped her quite a bit but the most recent one in January did not help her at all.  She is currently not taking daily pain medicines and is pursuing an exercise program on her own at home.  She has completed an MRI which available for review today and this has had treatment for osteopenia in the past.  Her last bone density was greater than a year ago.    Physical exam: 78 year old female mood affect within normal limits she stands 5 foot 4 weighs 136 pounds with a BMI of 23.34.  Examination of her lumbar spine shows no pain to palpation.  Limited range of motion secondary to back discomfort.  Motor function of the lower extremities full one-time testing including: EHL, anterior tib, quads, and hip flexors.  No pain with internal/external rotation of the hips.  Negative straight leg raise.  Sensation is intact distally into the left foot diminished in the left foot.  Eyes are PERRLA, respirations equal and unlabored, skin warm and dry without areas of breakdown.    Radiographs: X-rays of the lumbar spine obtained today show a degenerative scoliosis with asymmetric collapse of disc space on the left at L5-S1 and on the right at L3-4.  Overall she is pretty well-balanced.  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  MRI dated 11/06/2023 shows severe stenosis L4-5 moderate at 3 4 and pretty significant lateral recess and foraminal stenosis on the left at L5-S1.    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  We did review her x-rays and MRI with her as well as the pathoanatomy and pathophysiology problem.  One of her options for treatment.  Dr. Cleora Daft met with the patient today at the nurses station and would recommend obtaining a new bone density test.  This allows to determine whether her bones are strong enough to consider an operation.  The only operation that we have to offer her would be a at least an L3-5 decompression and fusion.  She most likely would also need a left 5 1 foramen decompressed.  The question is would we have to extend her instrumentation down to the sacrum.  Will see her back in the office once the bone density has been completed and put together a more definitive surgical plan at that time.         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    Pneumonia    Spinal stenosis       Surgical History:   Procedure Laterality Date    EGD N/A 09/21/2018    Performed by Mickel Alanis, MD at [facility] ENDO    ESOPHAGOGASTRODUODENOSCOPY WITH BIOPSY - FLEXIBLE  09/21/2018    Performed by Mickel Alanis, MD at [facility] ENDO    ESOPHAGOGASTRODUODENOSCOPY WITH BIOPSY - FLEXIBLE N/A 12/04/2018    Performed by Carmon Christen, MD at [facility] ENDO    EGD N/A 02/26/2019    Performed by Alethea Hutchinson, MD at [facility] ENDO       family history includes Cancer in her father and sister; Cancer-Lung in her father; Diabetes in her sister; Esophageal Cancer in her maternal grandfather; High Cholesterol in her sister; Hypertension in her sister.    Social History     Socioeconomic History    Marital status: Married     Spouse name: John    Number of children: 1   Tobacco Use    Smoking status: Former     Current packs/day: 0.00     Average packs/day: 1 pack/day for 50.0 years (50.0 ttl pk-yrs)     Types: Cigarettes     Start date: 64     Quit date: 2015     Years since quitting: 10.4    Smokeless tobacco: Never    Tobacco comments:     Can't really remember when I quit for good. First try 1971. Off and on from there   Vaping Use    Vaping status: Never Used   Substance and Sexual Activity    Alcohol use: Yes     Alcohol/week: 14.0 standard drinks of alcohol     Types: 7 Glasses of wine, 7 Standard drinks or equivalent per week    Drug use: Never    Sexual activity: Not Currently     Partners: Male     Birth control/protection: Post-menopausal       Allergies   Allergen Reactions    Oxycontin [Oxycodone] HALLUCINATIONS         Current Outpatient Medications:     albuterol 0.5% (PROVENTIL) 2.5 mg/0.5 mL nebulizer solution, Inhale 2.5 mg solution by nebulizer as directed every 6 hours as needed for Shortness of Breath or Wheezing., Disp: , Rfl:     aspirin EC 81 mg tablet, Take 81 mg by mouth daily. Take with food., Disp: , Rfl:     benzocaine/menthol (CHLORASEPTIC) 6-10 mg lozg lozenge, Take one lozenge by mouth every 2 hours as needed., Disp: , Rfl:     calcium carbonate (TUMS) 500 mg (200 mg elemental calcium) chewable tablet, Chew 500 mg by mouth daily., Disp: , Rfl:     cetirizine (ZYRTEC) 10 mg tablet, Take 10 mg by mouth every morning., Disp: , Rfl:     hydroCHLOROthiazide (HYDRODIURIL) 12.5 mg tablet, Take 12.5 mg by mouth every morning., Disp: , Rfl:     losartan (COZAAR) 50 mg tablet, Take 50 mg by mouth daily., Disp: , Rfl:     omeprazole DR (PRILOSEC) 20 mg capsule, Take one capsule by mouth daily before breakfast., Disp: 30 capsule, Rfl: 11    pantoprazole DR (PROTONIX) 40 mg tablet, Take one tablet by mouth twice daily., Disp: 120 tablet, Rfl: 0    polyethylene glycol 3350 (MIRALAX) 17 g packet, Take one packet by mouth daily as needed., Disp:  12 each, Rfl:     rosuvastatin (CRESTOR) 5 mg tablet, Take 5 mg by mouth daily., Disp: , Rfl:     Vitals:    11/17/23 1420   BP: (!) 141/66   Pulse: 85   SpO2: 100%   PainSc: Ten   Weight: 61.7 kg (136 lb)   Height: 162.6 cm (5' 4)       Oswestry Total Score:: (Patient-Rptd) 40    Pain Score: Ten    Body mass index is 23.34 kg/m?Aaron Aas    Review of Systems              Answers submitted by the patient for this visit:  Review Of Systems (Submitted on 11/12/2023)  Crying: No

## 2023-12-11 ENCOUNTER — Encounter: Admit: 2023-12-11 | Discharge: 2023-12-11 | Payer: MEDICARE

## 2023-12-15 ENCOUNTER — Encounter: Admit: 2023-12-15 | Discharge: 2023-12-15 | Payer: MEDICARE
